# Patient Record
Sex: Male | Born: 1940 | Race: White | Hispanic: No | Marital: Married | Smoking: Never smoker
Health system: Southern US, Community
[De-identification: ages and names within clinical notes are randomized; demographics above are authoritative.]

## PROBLEM LIST (undated history)

## (undated) DIAGNOSIS — I4891 Unspecified atrial fibrillation: Secondary | ICD-10-CM

## (undated) DIAGNOSIS — E785 Hyperlipidemia, unspecified: Secondary | ICD-10-CM

## (undated) DIAGNOSIS — I10 Essential (primary) hypertension: Secondary | ICD-10-CM

## (undated) HISTORY — PX: HERNIA REPAIR: SHX51

## (undated) HISTORY — PX: APPENDECTOMY: SHX54

## (undated) HISTORY — PX: CIRCUMCISION: SUR203

## (undated) HISTORY — PX: REPLACEMENT TOTAL KNEE: SUR1224

## (undated) HISTORY — PX: PROSTATECTOMY: SHX69

## (undated) HISTORY — PX: VASCULAR SURGERY: SHX849

## (undated) HISTORY — PX: SHOULDER ARTHROSCOPY: SHX128

---

## 2014-04-13 ENCOUNTER — Other Ambulatory Visit: Payer: Self-pay | Admitting: Neurosurgery

## 2014-04-13 DIAGNOSIS — M47812 Spondylosis without myelopathy or radiculopathy, cervical region: Secondary | ICD-10-CM

## 2014-04-16 ENCOUNTER — Ambulatory Visit
Admission: RE | Admit: 2014-04-16 | Discharge: 2014-04-16 | Disposition: A | Discharge status: Home / Self Care | Payer: Medicare Other | Source: Ambulatory Visit | Attending: Neurosurgery | Admitting: Neurosurgery

## 2014-04-16 ENCOUNTER — Other Ambulatory Visit: Payer: Self-pay | Admitting: Neurosurgery

## 2014-04-16 DIAGNOSIS — M47812 Spondylosis without myelopathy or radiculopathy, cervical region: Secondary | ICD-10-CM

## 2014-04-16 MED ORDER — IOHEXOL 300 MG/ML  SOLN: 1.0000 mL | Freq: Once | INTRAMUSCULAR | Status: AC | PRN

## 2014-04-16 NOTE — Discharge Instructions (Signed)

## 2021-10-26 ENCOUNTER — Ambulatory Visit: Payer: Medicare Other | Admitting: Family Medicine

## 2021-10-26 ENCOUNTER — Encounter: Payer: Self-pay | Admitting: Family Medicine

## 2021-10-26 ENCOUNTER — Ambulatory Visit: Payer: Self-pay

## 2021-10-26 VITALS — BP 138/56 | Ht 69.0 in | Wt 189.0 lb

## 2021-10-26 DIAGNOSIS — S76819A Strain of other specified muscles, fascia and tendons at thigh level, unspecified thigh, initial encounter: Secondary | ICD-10-CM

## 2021-10-26 DIAGNOSIS — S76012A Strain of muscle, fascia and tendon of left hip, initial encounter: Secondary | ICD-10-CM

## 2021-10-26 DIAGNOSIS — T148XXA Other injury of unspecified body region, initial encounter: Secondary | ICD-10-CM

## 2021-10-26 DIAGNOSIS — S76012D Strain of muscle, fascia and tendon of left hip, subsequent encounter: Secondary | ICD-10-CM | POA: Insufficient documentation

## 2021-10-26 DIAGNOSIS — M79652 Pain in left thigh: Secondary | ICD-10-CM

## 2021-10-26 NOTE — Assessment & Plan Note (Signed)
Acutely occurring.  No injury or inciting event.  Likely stemming from the tear of the gluteus medius proximally.   -Counseled on home exercise therapy and supportive care. -Could consider aspiration.

## 2021-10-26 NOTE — Patient Instructions (Signed)
Nice to meet you Please try compression  Please try try heat  Please continue physical therapy  Please send me a message in MyChart with any questions or updates.  Please see me back in 4-6 weeks or as needed if better.   --Dr. Jordan Likes

## 2021-10-26 NOTE — Assessment & Plan Note (Signed)
Acutely occurring.  Has significant changes at the footprint into the greater trochanter.  No specific injury. -Counseled on home exercise therapy and supportive care. -Could consider physical therapy or injection.

## 2021-10-26 NOTE — Progress Notes (Signed)
  Marcelus Garris - 81 y.o. male MRN [id number]  Date of birth: [birth year]  SUBJECTIVE:  Including CC & ROS.  No chief complaint on file.   Makoy Wyer is a 81 y.o. male that is presenting with acute left thigh pain.  The pain has been ongoing for over a week.  He is having significant bruising around the knee as well as the thigh region.  No injury inciting event.  Was painful enough that he had to walk with a walker at one point.  Has been given some prednisone.   Review of Systems See HPI   HISTORY: Past Medical, Surgical, Social, and Family History Reviewed & Updated per EMR.   Pertinent Historical Findings include:  History reviewed. No pertinent past medical history.  History reviewed. No pertinent surgical history.   PHYSICAL EXAM:  VS: BP (!) 138/56 (BP Location: Left Arm, Patient Position: Sitting)   Ht 5\' 9"  (1.753 m)   Wt 189 lb (85.7 kg)   BMI 27.91 kg/m  Physical Exam Gen: NAD, alert, cooperative with exam, well-appearing MSK:  Neurovascularly intact    Limited ultrasound: Left thigh and knee:  There is a morel lavallee lesion within the left lateral thigh.  This contains a mixed material to represent a bloody matrix. There is hypoechoic change and hyperemia at the insertion of the gluteus medius to indicate partial tear. There is changes within the rectus femoris and vastus intermedius at the mid belly of the muscle to represent a partial strain. Mild effusion within the suprapatellar pouch. Degenerative changes of the medial meniscus and joint space  Summary: Ander Gaster lavallee lesion with partial tear of the gluteus medius and partial tear of the mid belly of the rectus femoris and vastus intermedius  Ultrasound and interpretation by Clearance Coots, MD    ASSESSMENT & PLAN:   Strain of rectus femoris muscle Acutely occurring.  He is having improvement of his symptoms but is unsure of the mechanism of how his injury occurred. -Counseled on home exercise  therapy and supportive care. -Counseled on compression. -Could consider physical therapy.  Tear of left gluteus medius tendon Acutely occurring.  Has significant changes at the footprint into the greater trochanter.  No specific injury. -Counseled on home exercise therapy and supportive care. -Could consider physical therapy or injection.  Sherry Ruffing lesion Acutely occurring.  No injury or inciting event.  Likely stemming from the tear of the gluteus medius proximally.   -Counseled on home exercise therapy and supportive care. -Could consider aspiration.

## 2021-10-26 NOTE — Assessment & Plan Note (Signed)
Acutely occurring.  He is having improvement of his symptoms but is unsure of the mechanism of how his injury occurred. -Counseled on home exercise therapy and supportive care. -Counseled on compression. -Could consider physical therapy.

## 2021-11-10 ENCOUNTER — Ambulatory Visit: Payer: Self-pay

## 2021-11-10 ENCOUNTER — Ambulatory Visit: Payer: Medicare Other | Admitting: Family Medicine

## 2021-11-10 ENCOUNTER — Encounter: Payer: Self-pay | Admitting: Family Medicine

## 2021-11-10 VITALS — BP 118/70 | Ht 69.0 in | Wt 189.0 lb

## 2021-11-10 DIAGNOSIS — T148XXA Other injury of unspecified body region, initial encounter: Secondary | ICD-10-CM | POA: Diagnosis not present

## 2021-11-10 DIAGNOSIS — S76819A Strain of other specified muscles, fascia and tendons at thigh level, unspecified thigh, initial encounter: Secondary | ICD-10-CM | POA: Diagnosis not present

## 2021-11-10 DIAGNOSIS — S76012D Strain of muscle, fascia and tendon of left hip, subsequent encounter: Secondary | ICD-10-CM

## 2021-11-10 NOTE — Assessment & Plan Note (Signed)
Acutely worsening.  Still having pain and weakness with hip flexion.  X-rays performed at the [facility] on 5/30 were only showing degenerative changes within the left hip. -Counseled on home exercise therapy and supportive care. -MRI of the left femur to evaluate for muscle tear and for presurgical planning.

## 2021-11-10 NOTE — Progress Notes (Signed)
  Cameron Webb - 81 y.o. male MRN [id number]  Date of birth: 01/18/1941  SUBJECTIVE:  Including CC & ROS.  No chief complaint on file.   Cameron Webb is a 81 y.o. male that is presenting with worsening of his left leg pain.  Having significant swelling and ecchymosis.    Review of Systems See HPI   HISTORY: Past Medical, Surgical, Social, and Family History Reviewed & Updated per EMR.   Pertinent Historical Findings include:  History reviewed. No pertinent past medical history.  History reviewed. No pertinent surgical history.   PHYSICAL EXAM:  VS: BP 118/70 (BP Location: Left Arm, Patient Position: Sitting)   Ht 5\' 9"  (1.753 m)   Wt 189 lb (85.7 kg)   BMI 27.91 kg/m  Physical Exam Gen: NAD, alert, cooperative with exam, well-appearing MSK:  Left leg: Weakness to gravity with hip abduction. Stiffness along the left lateral and anterior thigh. Significant ecchymosis around the knee and distal thigh. Weakness with resistance with hip flexion  Neurovascularly intact    Limited ultrasound: Left thigh:  A significant hematoma in the left lateral thigh appreciated with a mixed internal architecture that measures 16 cm.  Summary: lesion  Ultrasound and interpretation by Norma Fredrickson, MD   Aspiration/Injection Procedure Note Cameron Webb [birth year]  Procedure: Aspiration and Injection Indications: Left thigh pain  Procedure Details Consent: Risks of procedure as well as the alternatives and risks of each were explained to the (patient/caregiver).  Consent for procedure obtained. Time Out: Verified patient identification, verified procedure, site/side was marked, verified correct patient position, special equipment/implants available, medications/allergies/relevent history reviewed, required imaging and test results available.  Performed.  The area was cleaned with iodine and alcohol swabs.    The left lateral thigh was injected using 3 cc of  1% lidocaine on the distal end of the hematoma as well as the mid belly of the hematoma using a 25-gauge 1-1/2 inch needle.  An 18-gauge 1-1/2 inch needle was used for aspiration.  An 18-gauge 1-1/2 needle with 400 cc of normal saline for irrigation was used.  Ultrasound was used. Images were obtained in long views showing the injection.    Amount of Fluid Aspirated:  90mL Character of Fluid: bloody Fluid was sent for: n/a  A sterile dressing was applied.  Patient did tolerate procedure well.    ASSESSMENT & PLAN:   72m lesion Acutely worsening.  The hematoma is extensive. -Counseled on home exercise therapy and supportive care. -Aspiration today. -MRI of the left femur to evaluate for size of the hematoma and for presurgical planning.  Strain of rectus femoris muscle Acutely worsening.  Still having pain and weakness with hip flexion.  X-[facility] performed at [facility] were only showing degenerative changes within the left hip. -Counseled on home exercise therapy and supportive care. -MRI of the left femur to evaluate for muscle tear and for presurgical planning.  Tear of left gluteus medius tendon Acutely worsening.  Having weakness with hip abduction.  X-rays performed at the [facility]30 on 5/30 have been under resulting.  Continues to have significant swelling and ecchymosis on exam. -Counseled on home exercise therapy and supportive care. -MRI of the pelvis to evaluate for tendon rupture and for presurgical planning.

## 2021-11-10 NOTE — Patient Instructions (Signed)
Good to see you Please continue heat and compression  We'll check on the MRi   Please send me a message in MyChart with any questions or updates.  We'll setup a virtual visit once the MRi is resulted.   --Dr. Jordan Likes

## 2021-11-10 NOTE — Assessment & Plan Note (Signed)
Acutely worsening.  Having weakness with hip abduction.  X-rays performed at the [place] on 5/30 have been under resulting.  Continues to have significant swelling and ecchymosis on exam. -Counseled on home exercise therapy and supportive care. -MRI of the pelvis to evaluate for tendon rupture and for presurgical planning.

## 2021-11-10 NOTE — Assessment & Plan Note (Signed)
Acutely worsening.  The hematoma is extensive. -Counseled on home exercise therapy and supportive care. -Aspiration today. -MRI of the left femur to evaluate for size of the hematoma and for presurgical planning.

## 2021-11-14 ENCOUNTER — Telehealth (HOSPITAL_BASED_OUTPATIENT_CLINIC_OR_DEPARTMENT_OTHER): Payer: Self-pay

## 2021-11-16 ENCOUNTER — Encounter: Payer: Self-pay | Admitting: Family Medicine

## 2021-11-16 ENCOUNTER — Ambulatory Visit: Payer: Medicare Other | Admitting: Family Medicine

## 2021-11-16 DIAGNOSIS — S76819A Strain of other specified muscles, fascia and tendons at thigh level, unspecified thigh, initial encounter: Secondary | ICD-10-CM | POA: Diagnosis not present

## 2021-11-16 DIAGNOSIS — T148XXA Other injury of unspecified body region, initial encounter: Secondary | ICD-10-CM

## 2021-11-16 DIAGNOSIS — S76012D Strain of muscle, fascia and tendon of left hip, subsequent encounter: Secondary | ICD-10-CM | POA: Diagnosis not present

## 2021-11-16 NOTE — Assessment & Plan Note (Signed)
Acutely occurring.  Still having pain in the anterior aspect of the leg.  Having weakness on exam. -Counseled on home exercise therapy and supportive care. -Continue MRI of the left femur to evaluate for rupture and for presurgical planning.

## 2021-11-16 NOTE — Assessment & Plan Note (Signed)
Acutely occurring.  Sizable difference from last week.  Has less fullness and less ecchymosis.  Has better range of motion and less pain. -Counseled on home exercise therapy and supportive care. -We will further evaluate with the MRI of the left femur for consideration of presurgical planning.

## 2021-11-16 NOTE — Progress Notes (Signed)
  [name] - 81 y.o. male MRN [id number]  Date of birth: 12-16-1940  SUBJECTIVE:  Including CC & ROS.  No chief complaint on file.   Cameron Webb is a 81 y.o. male that is following up for his left hip pain and left thigh pain.  He has gotten improvement with the previous aspiration.  The ecchymosis is continuing to improve in the lower aspect of his leg as well.   Review of Systems See HPI   HISTORY: Past Medical, Surgical, Social, and Family History Reviewed & Updated per EMR.   Pertinent Historical Findings include:  History reviewed. No pertinent past medical history.  History reviewed. No pertinent surgical history.   PHYSICAL EXAM:  VS: BP 126/62 (BP Location: Left Arm, Patient Position: Sitting)   Ht 5\' 9"  (1.753 m)   Wt 189 lb (85.7 kg)   BMI 27.91 kg/m  Physical Exam Gen: NAD, alert, cooperative with exam, well-appearing MSK:  Neurovascularly intact       ASSESSMENT & PLAN:   Tear of left gluteus medius tendon Still acutely occurring.  It appears that the bleeding has stopped.  His bruising has been improving.  Still having weakness on exam. -Counseled on home exercise therapy and supportive care. -Continue with the MRI of the pelvis to evaluate for rupture  Strain of rectus femoris muscle Acutely occurring.  Still having pain in the anterior aspect of the leg.  Having weakness on exam. -Counseled on home exercise therapy and supportive care. -Continue MRI of the left femur to evaluate for rupture and for presurgical planning.  lesion Acutely occurring.  Sizable difference from last week.  Has less fullness and less ecchymosis.  Has better range of motion and less pain. -Counseled on home exercise therapy and supportive care. -We will further evaluate with the MRI of the left femur for consideration of presurgical planning.

## 2021-11-16 NOTE — Assessment & Plan Note (Signed)
Still acutely occurring.  It appears that the bleeding has stopped.  His bruising has been improving.  Still having weakness on exam. -Counseled on home exercise therapy and supportive care. -Continue with the MRI of the pelvis to evaluate for rupture

## 2021-11-18 ENCOUNTER — Ambulatory Visit (HOSPITAL_BASED_OUTPATIENT_CLINIC_OR_DEPARTMENT_OTHER)
Admission: RE | Admit: 2021-11-18 | Discharge: 2021-11-18 | Disposition: A | Discharge status: Home / Self Care | Payer: Medicare Other | Source: Ambulatory Visit | Attending: Family Medicine | Admitting: Family Medicine

## 2021-11-18 DIAGNOSIS — S76012D Strain of muscle, fascia and tendon of left hip, subsequent encounter: Secondary | ICD-10-CM

## 2021-11-18 DIAGNOSIS — S76819A Strain of other specified muscles, fascia and tendons at thigh level, unspecified thigh, initial encounter: Secondary | ICD-10-CM | POA: Insufficient documentation

## 2021-11-18 DIAGNOSIS — T148XXA Other injury of unspecified body region, initial encounter: Secondary | ICD-10-CM | POA: Diagnosis present

## 2021-11-18 IMAGING — MR MR PELVIS W/O CM
4 of 5 series · 16 of 48 positions shown · non-contrast
Comparison: None Available.

CLINICAL DATA: Soft tissue mass, pelvis, US/xray nondiagnostic;
Soft tissue mass, thigh, US/xray nondiagnostic

EXAM:
MRI PELVIS WITHOUT CONTRAST
TECHNIQUE: Multiplanar multisequence MR imaging of the pelvis was performed. No
intravenous contrast was administered.

[Series 2: T1 · axial · 4.0mm · 0.49mm/px · z∈[-38,+77]mm · 7 of 28 slices shown (1 of 2)]
[im 1/28]
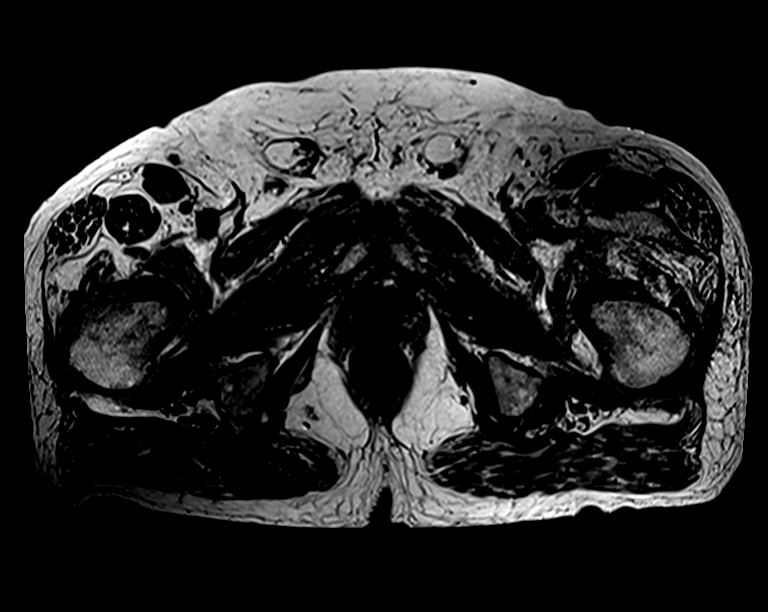
[im 4/28]
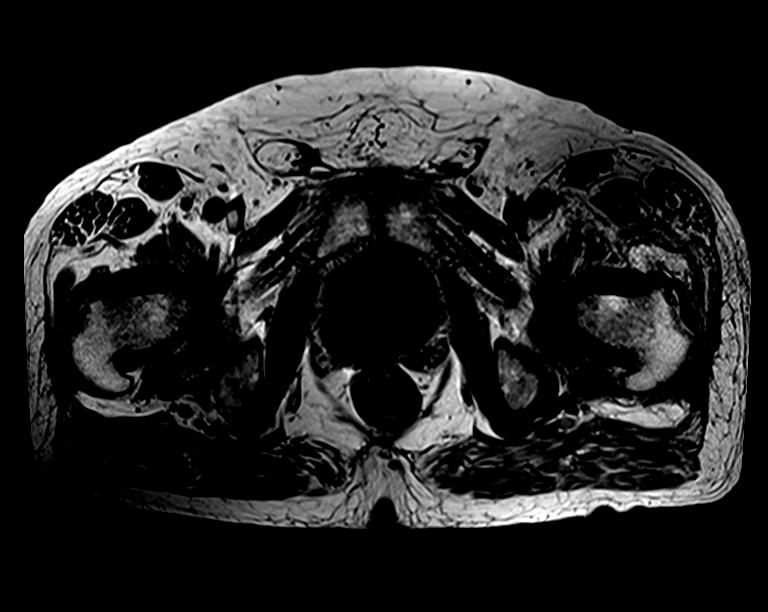
[im 7/28]
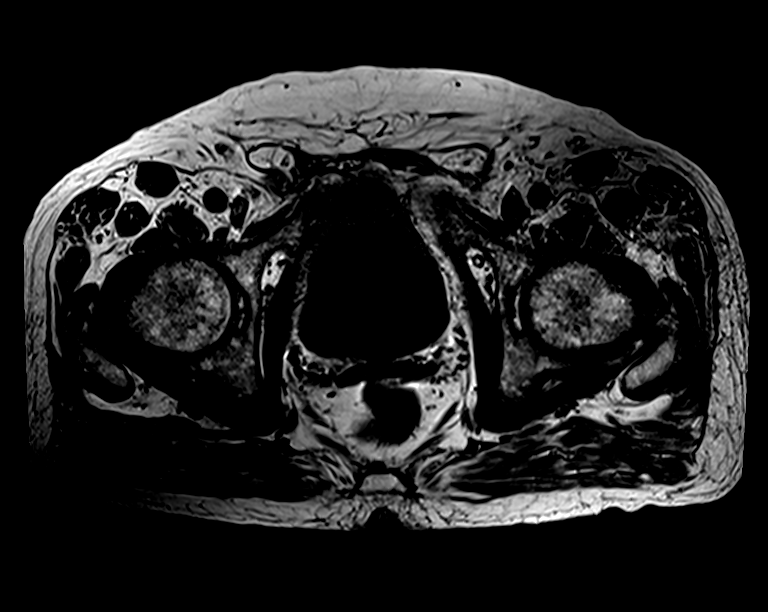
[im 11/28]
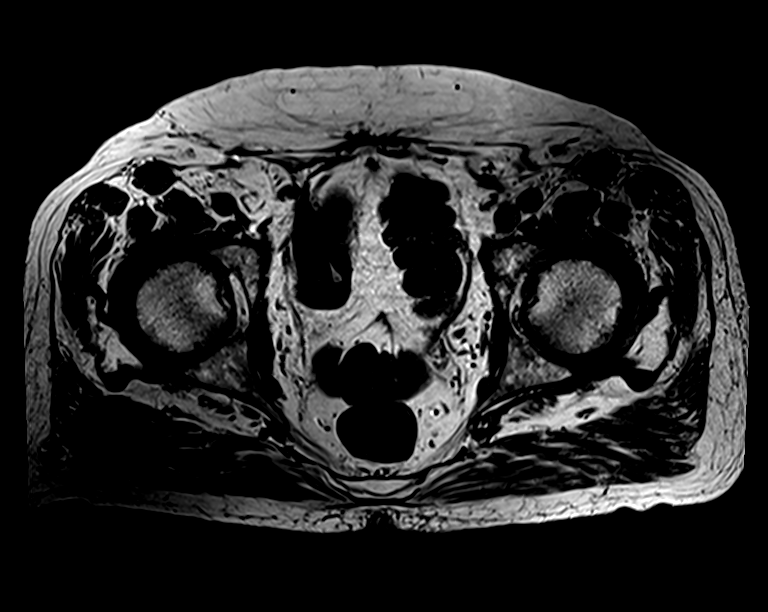
[im 14/28]
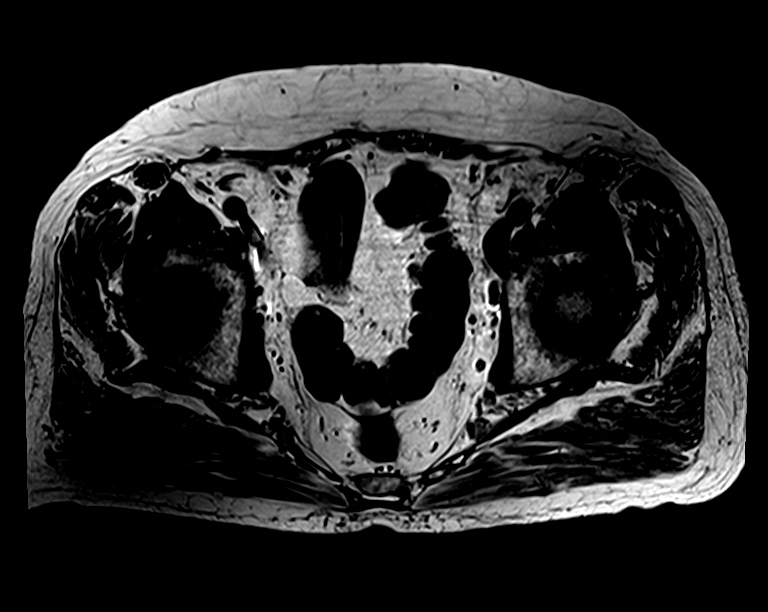
[im 17/28]
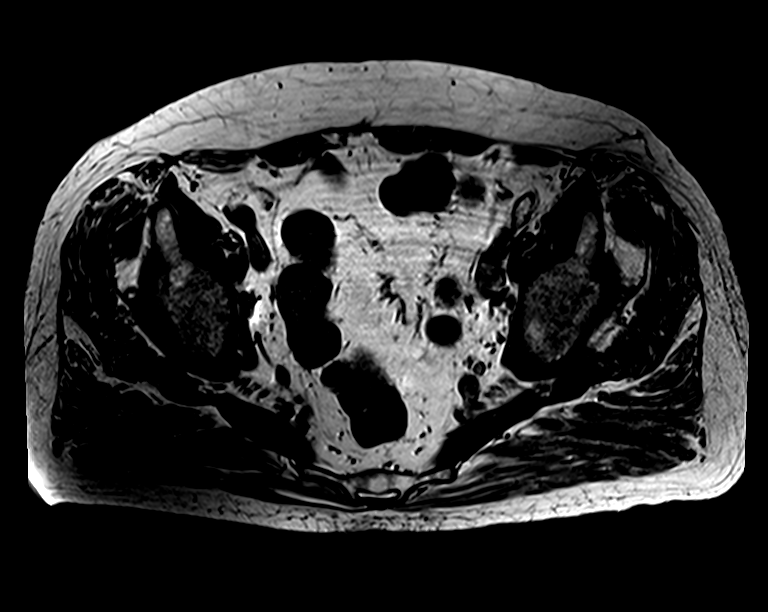
[im 24/28]
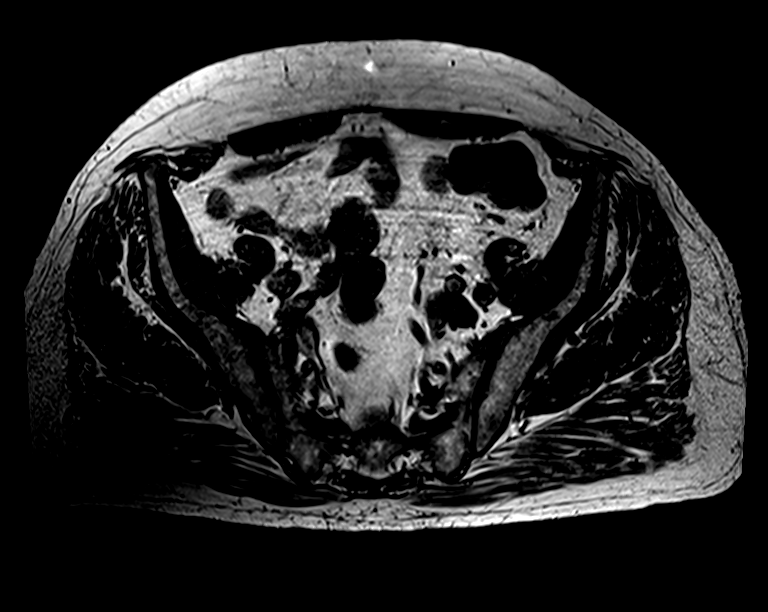

[Series 3: T2 fat-sat · axial · 4.0mm · 1.19mm/px · z∈[-20,+75]mm · 3 of 27 slices shown]
[im 4/27]
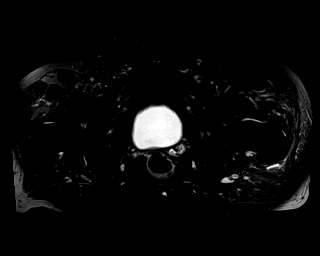
[im 15/27]
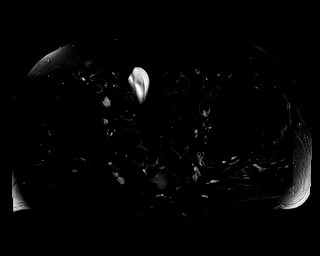
[im 23/27]
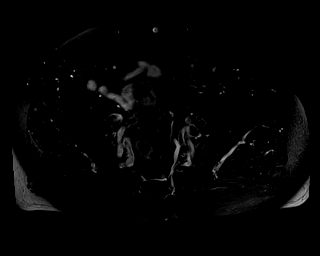

[Series 4: T1 · coronal · 4.0mm · 0.78mm/px · 3 of 34 slices shown (2 of 2)]
[im 4/34]
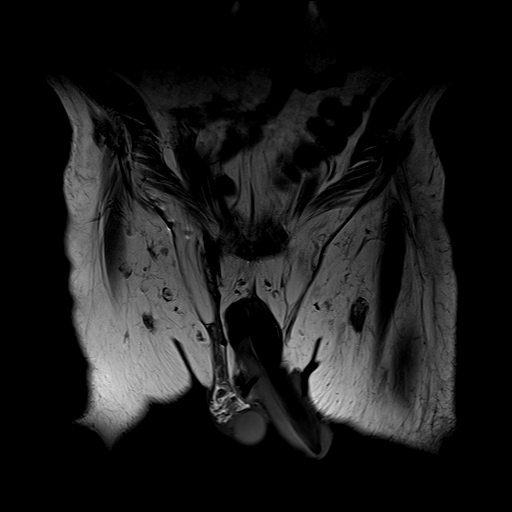
[im 17/34]
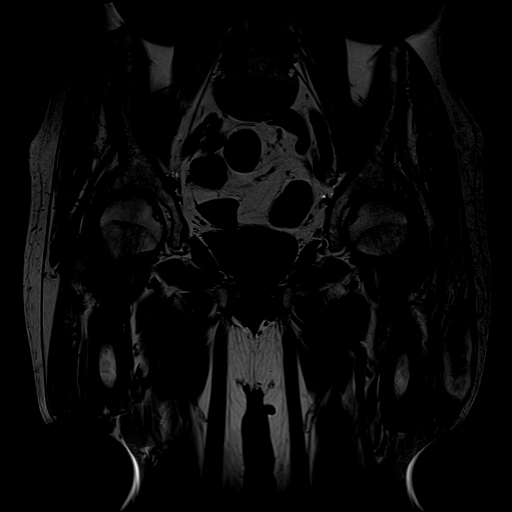
[im 30/34]
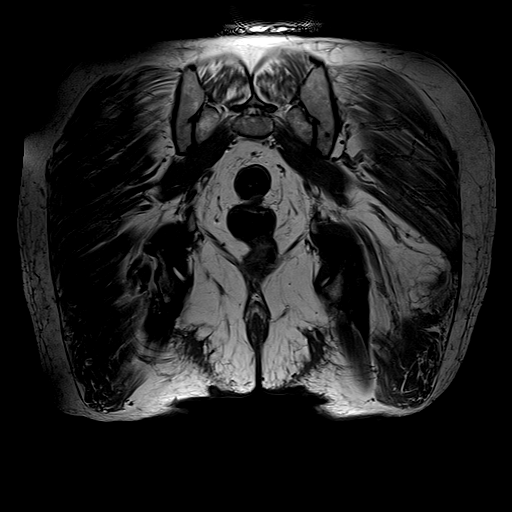

[Series 5: STIR · coronal · 4.0mm · 1.04mm/px · 3 of 34 slices shown]
[im 4/34]
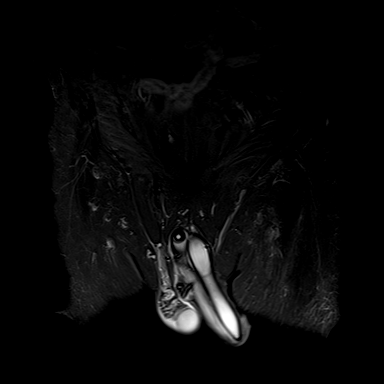
[im 17/34]
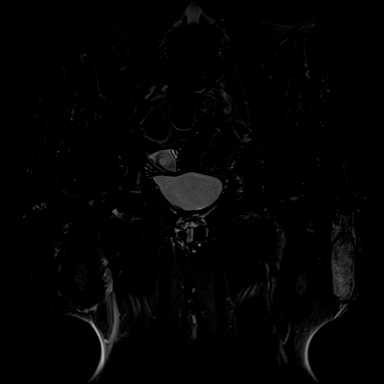
[im 30/34]
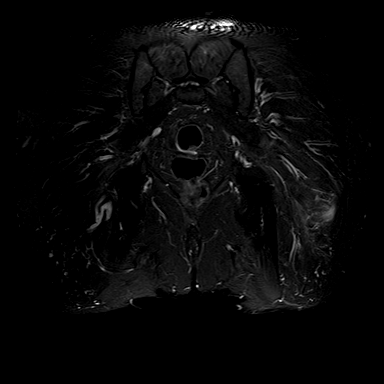

[16 of 48 positions shown; findings below may reference images not displayed]

FINDINGS: MR PELVIS:

Urinary Tract:  No abnormality visualized.

Bowel:  Unremarkable visualized pelvic bowel loops.

Vascular/Lymphatic: No pathologically enlarged lymph nodes. No
significant vascular abnormality seen.

Reproductive: There is a penile implant reservoir for in the pelvis.

Other: There are small fat containing inguinal hernias, left greater
than right.

Musculoskeletal: There is mild to moderate osteoarthritis of the
hips bilaterally. No evidence of acute fracture. No focal bone
lesion. There are mild degenerative changes of the sacroiliac joints
and of the pubic symphysis. Degenerative disc disease in the lower
lumbar spine.

There is low-grade insertional tearing of the left gluteus minimus
tendon at the greater trochanter. The left gluteus medius tendon
appears intact. There is mild peritrochanteric edema and
trochanteric bursal distension. Mild tendinosis of the right gluteal
tendons. Mild stenosis of the left proximal hamstrings.

MR FEMUR:

Bones/Joint/Cartilage

There is no significant marrow signal alteration. The cortex is
intact. There is a suspected nondisplaced horizontal tear involving
the medial meniscal body-horn-body junction. There is tear involving
the inner zone of the lateral meniscal body as seen on prior MRI.

Ligaments

Sprain and partial tearing of the left knee PCL as seen on prior
MRI.

Muscles and Tendons
There is mild tendinosis of the left proximal hamstrings. There is a
large heterogeneous collection along the anterolateral thigh
measuring 7.9 x 4.0 in the axial dimension and 18.3 cm in
craniocaudal extent (series 17, image 25, series 13 image 27). This
is along the surface of the vastus lateralis, with continuation
proximally deep to the proximal rectus femoris. There is inter
fascial edema along the rectus femoris with focal defect along the
lateral muscle belly likely reflecting a myofascial tear (series 17,
images 31-33). There is mild intramuscular edema signal within the
vastus intermedius proximally and of the vastus lateralis distally
likely low-grade myotendinous strain.

Soft tissue
There is superficial soft tissue swelling.
IMPRESSION: Large heterogeneous collection along the anterolateral left thigh
measuring 7.9 x 4.0 cm in the axial dimension and 8.3 cm in
craniocaudal extent. This is most consistent with a large
hematoma/LEZAMA lesion. Recommend clinical follow-up.

Interfascial edema with associated probable small myofascial tear
involving the lateral aspect of the proximal rectus femoris.
Low-grade strain of the vastus intermedius proximally and of the
vastus lateralis distally.

Low-grade insertional tearing of the distal gluteus minimus tendon
at the greater trochanter. Intact gluteus medius tendon. Adjacent
peritrochanteric edema and mild bursal distension.

No acute osseous abnormality. Mild-to-moderate osteoarthritis of the
hips.

## 2021-11-18 IMAGING — MR MR FEMUR*L* W/O CM
10 series · 40 of 40 positions shown · non-contrast
Comparison: None Available.

CLINICAL DATA: Soft tissue mass, pelvis, US/xray nondiagnostic;
Soft tissue mass, thigh, US/xray nondiagnostic

EXAM:
MRI PELVIS WITHOUT CONTRAST
TECHNIQUE: Multiplanar multisequence MR imaging of the pelvis was performed. No
intravenous contrast was administered.

[Series 11: T1 · coronal · 5.0mm · 1.56mm/px · 4 of 42 slices shown (1 of 4)]
[im 1/42]
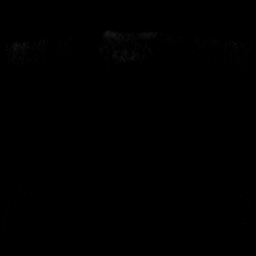
[im 14/42]
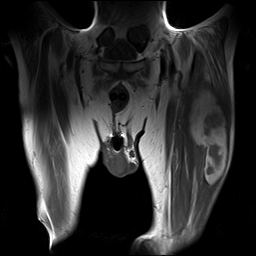
[im 28/42]
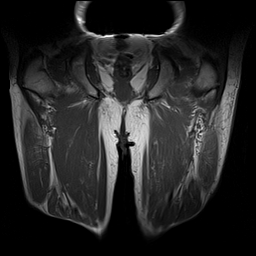
[im 42/42]
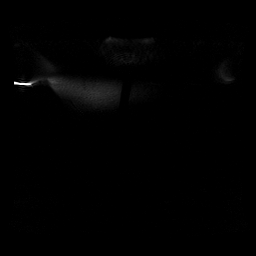

[Series 12: T1 · coronal · 5.0mm · 1.56mm/px · 4 of 36 slices shown (2 of 4)]
[im 1/36]
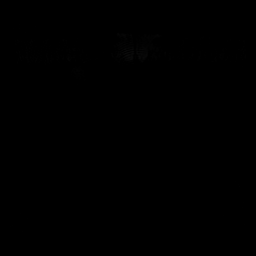
[im 12/36]
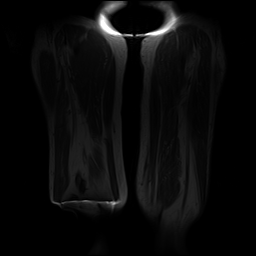
[im 24/36]
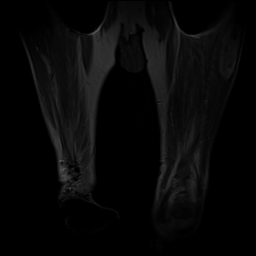
[im 36/36]
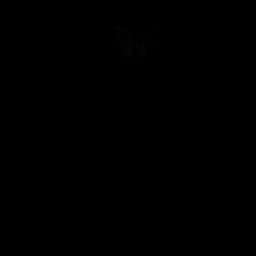

[Series 13: STIR · coronal · 5.0mm · 1.56mm/px · 4 of 42 slices shown (1 of 4)]
[im 1/42]
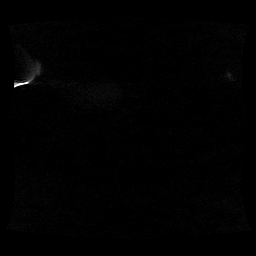
[im 14/42]
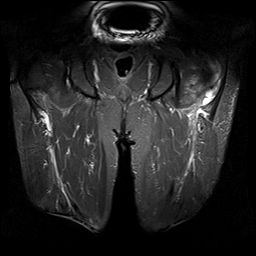
[im 28/42]
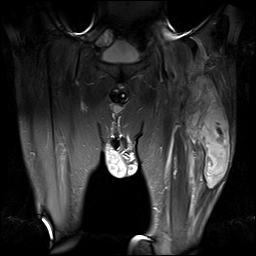
[im 42/42]
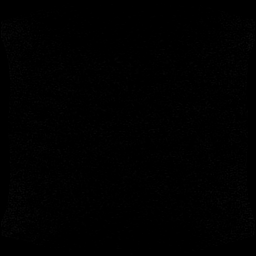

[Series 14: STIR · coronal · 5.0mm · 1.56mm/px · 4 of 35 slices shown (2 of 4)]
[im 1/35]
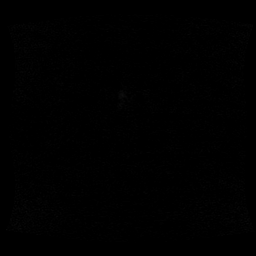
[im 12/35]
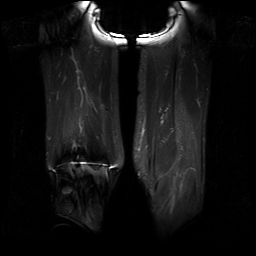
[im 23/35]
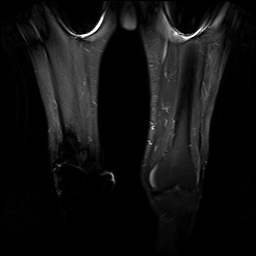
[im 35/35]
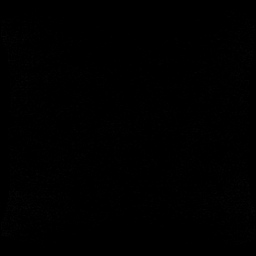

[Series 15: STIR · sagittal · 4.0mm · 1.17mm/px · 4 of 38 slices shown (3 of 4)]
[im 1/38]
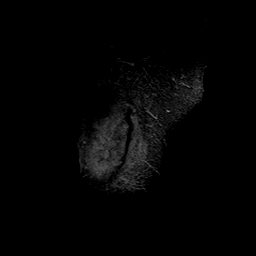
[im 13/38]
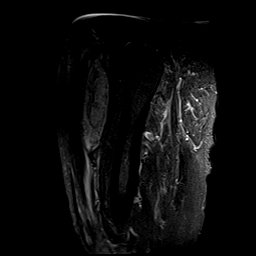
[im 25/38]
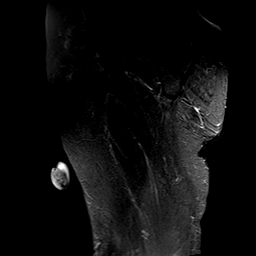
[im 38/38]
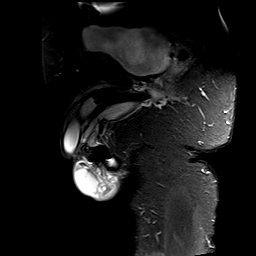

[Series 16: STIR · sagittal · 4.0mm · 1.17mm/px · 4 of 38 slices shown (4 of 4)]
[im 1/38]
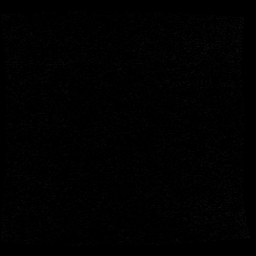
[im 13/38]
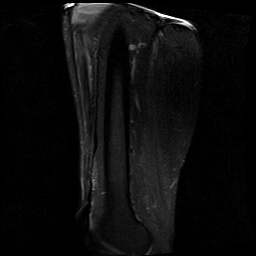
[im 25/38]
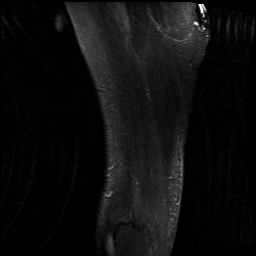
[im 38/38]
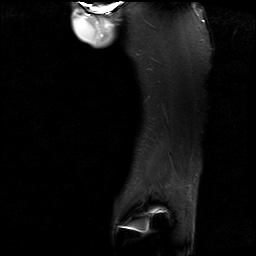

[Series 17: T2 fat-sat · axial · 5.0mm · 1.02mm/px · z∈[-217,+36]mm · 4 of 40 slices shown (1 of 2)]
[im 1/40]
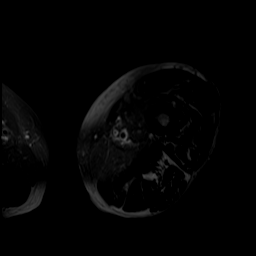
[im 14/40]
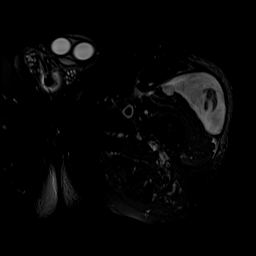
[im 27/40]
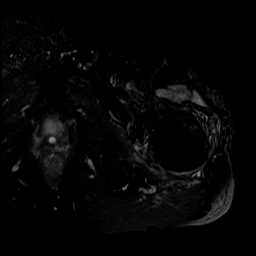
[im 40/40]
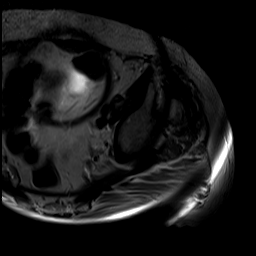

[Series 18: T2 fat-sat · axial · 5.0mm · 1.02mm/px · z∈[-456,-202]mm · 4 of 40 slices shown (2 of 2)]
[im 1/40]
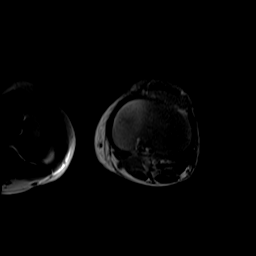
[im 14/40]
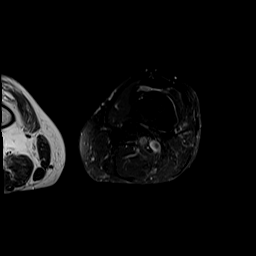
[im 27/40]
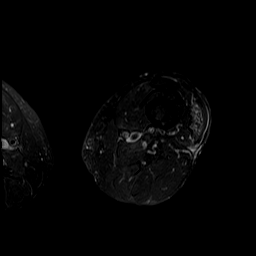
[im 40/40]
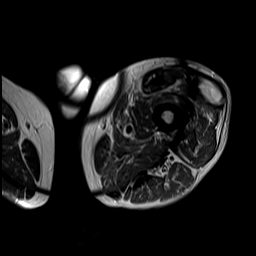

[Series 19: T1 · axial · 5.0mm · 1.02mm/px · z∈[-217,+36]mm · 4 of 40 slices shown (3 of 4)]
[im 1/40]
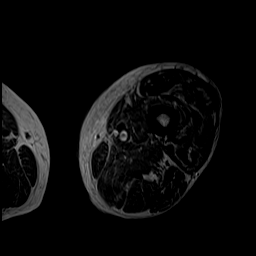
[im 14/40]
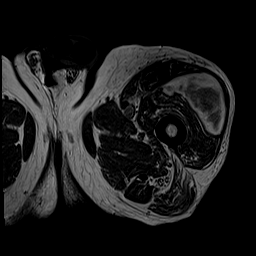
[im 27/40]
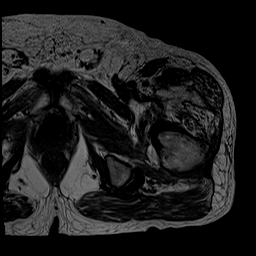
[im 40/40]
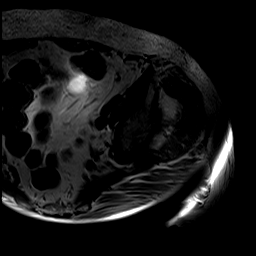

[Series 20: T1 · axial · 5.0mm · 1.02mm/px · z∈[-456,-202]mm · 4 of 40 slices shown (4 of 4)]
[im 1/40]
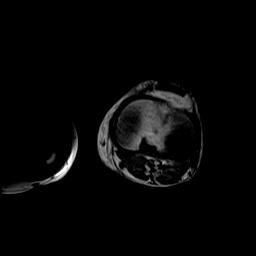
[im 14/40]
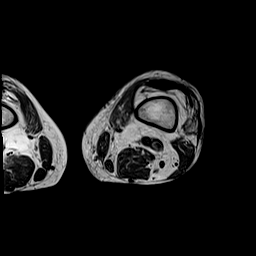
[im 27/40]
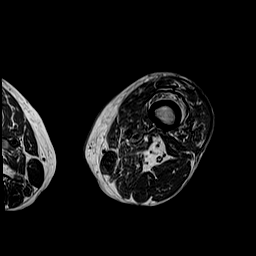
[im 40/40]
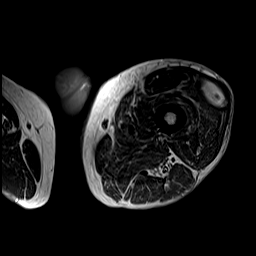

[40 of 40 positions shown; findings below may reference images not displayed]

FINDINGS: MR PELVIS:

Urinary Tract:  No abnormality visualized.

Bowel:  Unremarkable visualized pelvic bowel loops.

Vascular/Lymphatic: No pathologically enlarged lymph nodes. No
significant vascular abnormality seen.

Reproductive: There is a penile implant reservoir for in the pelvis.

Other: There are small fat containing inguinal hernias, left greater
than right.

Musculoskeletal: There is mild to moderate osteoarthritis of the
hips bilaterally. No evidence of acute fracture. No focal bone
lesion. There are mild degenerative changes of the sacroiliac joints
and of the pubic symphysis. Degenerative disc disease in the lower
lumbar spine.

There is low-grade insertional tearing of the left gluteus minimus
tendon at the greater trochanter. The left gluteus medius tendon
appears intact. There is mild peritrochanteric edema and
trochanteric bursal distension. Mild tendinosis of the right gluteal
tendons. Mild stenosis of the left proximal hamstrings.

MR FEMUR:

Bones/Joint/Cartilage

There is no significant marrow signal alteration. The cortex is
intact. There is a suspected nondisplaced horizontal tear involving
the medial meniscal body-horn-body junction. There is tear involving
the inner zone of the lateral meniscal body as seen on prior MRI.

Ligaments

Sprain and partial tearing of the left knee PCL as seen on prior
MRI.

Muscles and Tendons
There is mild tendinosis of the left proximal hamstrings. There is a
large heterogeneous collection along the anterolateral thigh
measuring 7.9 x 4.0 in the axial dimension and 18.3 cm in
craniocaudal extent (series 17, image 25, series 13 image 27). This
is along the surface of the vastus lateralis, with continuation
proximally deep to the proximal rectus femoris. There is inter
fascial edema along the rectus femoris with focal defect along the
lateral muscle belly likely reflecting a myofascial tear (series 17,
images 31-33). There is mild intramuscular edema signal within the
vastus intermedius proximally and of the vastus lateralis distally
likely low-grade myotendinous strain.

Soft tissue
There is superficial soft tissue swelling.
IMPRESSION: Large heterogeneous collection along the anterolateral left thigh
measuring 7.9 x 4.0 cm in the axial dimension and 8.3 cm in
craniocaudal extent. This is most consistent with a large
hematoma/LEZAMA lesion. Recommend clinical follow-up.

Interfascial edema with associated probable small myofascial tear
involving the lateral aspect of the proximal rectus femoris.
Low-grade strain of the vastus intermedius proximally and of the
vastus lateralis distally.

Low-grade insertional tearing of the distal gluteus minimus tendon
at the greater trochanter. Intact gluteus medius tendon. Adjacent
peritrochanteric edema and mild bursal distension.

No acute osseous abnormality. Mild-to-moderate osteoarthritis of the
hips.

## 2021-11-23 ENCOUNTER — Encounter: Payer: Self-pay | Admitting: Family Medicine

## 2021-11-23 ENCOUNTER — Ambulatory Visit: Payer: Medicare Other | Admitting: Family Medicine

## 2021-11-23 DIAGNOSIS — S76819A Strain of other specified muscles, fascia and tendons at thigh level, unspecified thigh, initial encounter: Secondary | ICD-10-CM | POA: Diagnosis not present

## 2021-11-23 DIAGNOSIS — T148XXA Other injury of unspecified body region, initial encounter: Secondary | ICD-10-CM

## 2021-11-23 DIAGNOSIS — S76012D Strain of muscle, fascia and tendon of left hip, subsequent encounter: Secondary | ICD-10-CM

## 2021-11-23 NOTE — Assessment & Plan Note (Signed)
MRI was showing a partial tear of the proximal portion as well as the vastus intermedius. -Counseled on home exercise therapy and supportive care. -Continue physical therapy. -Could consider shockwave therapy.

## 2021-11-23 NOTE — Assessment & Plan Note (Signed)
This was confirmed on MRI.  Has shown improvement since initial aspiration and ultrasound. -Counseled on home exercise therapy and supportive care. -Continue physical therapy. -Could consider referral to surgery for evacuation.

## 2021-11-23 NOTE — Progress Notes (Signed)
  Cameron Webb - 81 y.o. male MRN [id number]  Date of birth: 1940/08/04  SUBJECTIVE:  Including CC & ROS.  No chief complaint on file.   Cameron Webb is a 81 y.o. male that is following up on the MRI of his pelvis and left femur.  These were demonstrating a lesion on the left lateral thigh that seems most consistent with [facility] lesion.  This was also demonstrating interstitial edema with a tear in the proximal rectus femoris and a strain of the vastus intermedius.  His gluteus minimus tendon also demonstrated tearing at the distal insertion.   Review of Systems See HPI   HISTORY: Past Medical, Surgical, Social, and Family History Reviewed & Updated per EMR.   Pertinent Historical Findings include:  History reviewed. No pertinent past medical history.  History reviewed. No pertinent surgical history.   PHYSICAL EXAM:  VS: BP 128/69 (BP Location: Left Arm, Patient Position: Sitting)   Ht 5\' 9"  (1.753 m)   Wt 189 lb (85.7 kg)   BMI 27.91 kg/m  Physical Exam Gen: NAD, alert, cooperative with exam, well-appearing MSK:  Neurovascularly intact       ASSESSMENT & PLAN:   lesion This was confirmed on MRI.  Has shown improvement since initial aspiration and ultrasound. -Counseled on home exercise therapy and supportive care. -Continue physical therapy. -Could consider referral to surgery for evacuation.  Strain of rectus femoris muscle MRI was showing a partial tear of the proximal portion as well as the vastus intermedius. -Counseled on home exercise therapy and supportive care. -Continue physical therapy. -Could consider shockwave therapy.  Rupture of gluteus minimus tendon, left, subsequent encounter MRI was demonstrating gluteus minimus is the issue. -Counseled on home exercise therapy and supportive care. -Continue physical therapy. -Could consider shockwave therapy or injection.

## 2021-11-23 NOTE — Assessment & Plan Note (Signed)
MRI was demonstrating gluteus minimus is the issue. -Counseled on home exercise therapy and supportive care. -Continue physical therapy. -Could consider shockwave therapy or injection.

## 2022-01-04 ENCOUNTER — Encounter: Payer: Self-pay | Admitting: Family Medicine

## 2022-01-04 ENCOUNTER — Ambulatory Visit: Payer: Medicare Other | Admitting: Family Medicine

## 2022-01-04 DIAGNOSIS — T148XXA Other injury of unspecified body region, initial encounter: Secondary | ICD-10-CM

## 2022-01-04 NOTE — Patient Instructions (Signed)
Good to see you Please use heat as needed  Please continue the exercises   Please send me a message in MyChart with any questions or updates.  Please see me back as needed.   --Dr. Ernest Pine DO [facility] [address] [facility]  Velna Hatchet MD [facility] [address]  Verdie Mosher MD [facility] [address]

## 2022-01-04 NOTE — Assessment & Plan Note (Signed)
Doing well with near complete resolution of his symptoms with therapy. -Counseled on home exercise therapy and supportive care. -Could consider shockwave therapy.

## 2022-01-04 NOTE — Progress Notes (Signed)
  Cameron Webb - 81 y.o. male MRN [id number]  Date of birth: 06-30-1940  SUBJECTIVE:  Including CC & ROS.  No chief complaint on file.   Cameron Webb is a 81 y.o. male that is following up for his left leg pain.  He has significant improvement with physical therapy and no longer has swelling or ecchymosis.   Review of Systems See HPI   HISTORY: Past Medical, Surgical, Social, and Family History Reviewed & Updated per EMR.   Pertinent Historical Findings include:  History reviewed. No pertinent past medical history.  History reviewed. No pertinent surgical history.   PHYSICAL EXAM:  VS: BP 108/60 (BP Location: Left Arm, Patient Position: Sitting)   Ht 5\' 9"  (1.753 m)   Wt 189 lb (85.7 kg)   BMI 27.91 kg/m  Physical Exam Gen: NAD, alert, cooperative with exam, well-appearing MSK:  Neurovascularly intact       ASSESSMENT & PLAN:   lesion Doing well with near complete resolution of his symptoms with therapy. -Counseled on home exercise therapy and supportive care. -Could consider shockwave therapy.

## 2022-01-27 ENCOUNTER — Encounter (HOSPITAL_BASED_OUTPATIENT_CLINIC_OR_DEPARTMENT_OTHER): Payer: Self-pay | Admitting: Emergency Medicine

## 2022-01-27 ENCOUNTER — Emergency Department (HOSPITAL_BASED_OUTPATIENT_CLINIC_OR_DEPARTMENT_OTHER)
Admission: EM | Admit: 2022-01-27 | Discharge: 2022-01-27 | Disposition: A | Discharge status: Home / Self Care | Payer: Medicare Other | Attending: Emergency Medicine | Admitting: Emergency Medicine

## 2022-01-27 DIAGNOSIS — B029 Zoster without complications: Secondary | ICD-10-CM | POA: Diagnosis not present

## 2022-01-27 DIAGNOSIS — R21 Rash and other nonspecific skin eruption: Secondary | ICD-10-CM | POA: Diagnosis present

## 2022-01-27 MED ORDER — VALACYCLOVIR HCL 1 G PO TABS: 1000.0000 mg | ORAL_TABLET | Freq: Three times a day (TID) | ORAL | 0 refills | Status: DC

## 2022-01-27 NOTE — Discharge Instructions (Addendum)
Take the Valtrex antiviral medicine for the shingles.  Take it as directed for 7 days.  Avoid contact with people that have not had the chickenpox or young children or pregnant women.  Keep the area covered.  Follow-up with your doctor as needed.

## 2022-01-27 NOTE — ED Triage Notes (Signed)
Pt with small rash to LT side; wants to make sure it's not shingles

## 2022-01-27 NOTE — ED Provider Notes (Signed)
[facility] EMERGENCY DEPARTMENT Provider Note   CSN: [id number] Arrival date & time: 01/27/22  1850     History  Chief Complaint  Patient presents with   Rash    Cameron Webb is a 81 y.o. male.  Patient concerned about the shingles noticed a rash development on his left lateral chest wall picking up this morning.  Not itching in nature.  Has blisters no pain associated with it before hand.  Patient has had the shingles vaccine.  Past medical history noncontributory otherwise.       Home Medications Prior to Admission medications   Medication Sig Start Date End Date Taking? Authorizing Provider  valACYclovir (VALTREX) 1000 MG tablet Take 1 tablet (1,000 mg total) by mouth 3 (three) times daily. 01/27/22  Yes Vanetta Mulders, MD      Allergies    Patient has no known allergies.    Review of Systems   Review of Systems  Constitutional:  Negative for chills and fever.  HENT:  Negative for ear pain and sore throat.   Eyes:  Negative for pain and visual disturbance.  Respiratory:  Negative for cough and shortness of breath.   Cardiovascular:  Negative for chest pain and palpitations.  Gastrointestinal:  Negative for abdominal pain and vomiting.  Genitourinary:  Negative for dysuria and hematuria.  Musculoskeletal:  Negative for arthralgias and back pain.  Skin:  Positive for rash. Negative for color change.  Neurological:  Negative for seizures and syncope.  All other systems reviewed and are negative.   Physical Exam Updated Vital Signs BP (!) 155/71   Pulse 66   Temp 98.7 F (37.1 C) (Oral)   Resp 18   Ht 1.753 m (5\' 9" )   Wt 89.8 kg   SpO2 98%   BMI 29.24 kg/m  Physical Exam Vitals and nursing note reviewed.  Constitutional:      General: He is not in acute distress.    Appearance: Normal appearance. He is well-developed. He is not ill-appearing.  HENT:     Head: Normocephalic and atraumatic.  Eyes:     Conjunctiva/sclera: Conjunctivae  normal.  Cardiovascular:     Rate and Rhythm: Normal rate and regular rhythm.     Heart sounds: No murmur heard. Pulmonary:     Effort: Pulmonary effort is normal. No respiratory distress.     Breath sounds: Normal breath sounds.  Abdominal:     Palpations: Abdomen is soft.     Tenderness: There is no abdominal tenderness.  Musculoskeletal:        General: No swelling.     Cervical back: Neck supple.  Skin:    General: Skin is warm and dry.     Capillary Refill: Capillary refill takes less than 2 seconds.     Findings: Erythema and rash present.     Comments: Cluster of vesicular type lesions left lateral chest area.  Somewhat circumferential not linear.  Also there is 1 lesion more anteriorly from that.  Posterior part of the back without evidence of any rash.  Neurological:     General: No focal deficit present.     Mental Status: He is alert and oriented to person, place, and time.  Psychiatric:        Mood and Affect: Mood normal.     ED Results / Procedures / Treatments   Labs (all labs ordered are listed, but only abnormal results are displayed) Labs Reviewed - No data to display  EKG None  Radiology  No results found.  Procedures Procedures    Medications Ordered in ED Medications - No data to display  ED Course/ Medical Decision Making/ A&P                           Medical Decision Making Risk Prescription drug management.   Visually suggestive of shingles.  But no pain associated with it.  Also no itching.  So I doubt the contact dermatitis.  It is a vesicular rash lesion does seem to follow a little bit of a dermatome.  We will start him when on Valtrex.  And follow-up with his primary care doctor. Final Clinical Impression(s) / ED Diagnoses Final diagnoses:  Herpes zoster without complication    Rx / DC Orders ED Discharge Orders          Ordered    valACYclovir (VALTREX) 1000 MG tablet  3 times daily        01/27/22 1928               Vanetta Mulders, MD 01/27/22 [phone]

## 2022-05-26 ENCOUNTER — Other Ambulatory Visit: Payer: Self-pay

## 2022-05-26 ENCOUNTER — Encounter (HOSPITAL_BASED_OUTPATIENT_CLINIC_OR_DEPARTMENT_OTHER): Payer: Self-pay | Admitting: Emergency Medicine

## 2022-05-26 ENCOUNTER — Emergency Department (HOSPITAL_BASED_OUTPATIENT_CLINIC_OR_DEPARTMENT_OTHER)
Admission: EM | Admit: 2022-05-26 | Discharge: 2022-05-26 | Disposition: A | Discharge status: Home / Self Care | Payer: Medicare Other | Attending: Emergency Medicine | Admitting: Emergency Medicine

## 2022-05-26 DIAGNOSIS — U071 COVID-19: Secondary | ICD-10-CM

## 2022-05-26 DIAGNOSIS — J029 Acute pharyngitis, unspecified: Secondary | ICD-10-CM

## 2022-05-26 HISTORY — DX: Unspecified atrial fibrillation: I48.91

## 2022-05-26 HISTORY — DX: Hyperlipidemia, unspecified: E78.5

## 2022-05-26 LAB — RESP PANEL BY RT-PCR (RSV, FLU A&B, COVID)  RVPGX2
Influenza A by PCR: NEGATIVE
Influenza B by PCR: NEGATIVE
Resp Syncytial Virus by PCR: NEGATIVE
SARS Coronavirus 2 by RT PCR: POSITIVE — AB

## 2022-05-26 LAB — GROUP A STREP BY PCR: Group A Strep by PCR: NOT DETECTED

## 2022-05-26 MED ORDER — MOLNUPIRAVIR EUA 200MG CAPSULE: 4.0000 | ORAL_CAPSULE | Freq: Two times a day (BID) | ORAL | 0 refills | Status: AC

## 2022-05-26 NOTE — Discharge Instructions (Addendum)
You were diagnosed today with COVID-19.  Please take the prescribed antiviral therapy as directed.  He may take over-the-counter medication such as acetaminophen or ibuprofen as needed for fever and pain control.  If you develop any life-threatening symptoms such as severe shortness of breath, please return to the emergency department for reevaluation.

## 2022-05-26 NOTE — ED Provider Notes (Signed)
[facility] EMERGENCY DEPARTMENT Provider Note   CSN: [id number] Arrival date & time: 05/26/22  1048     History  Chief Complaint  Patient presents with   Sore Throat    Cameron Webb is a 81 y.o. male.  Patient presents emergency department complaining of 1 day of sore throat.  Denies cough, shortness of breath, headache, body aches, chills, fevers.  Past medical history significant for atrial fibrillation, hyperlipidemia  HPI     Home Medications Prior to Admission medications   Medication Sig Start Date End Date Taking? Authorizing Provider  molnupiravir EUA (LAGEVRIO) 200 mg CAPS capsule Take 4 capsules (800 mg total) by mouth 2 (two) times daily for 5 days. 05/26/22 05/31/22 Yes Darrick Grinder, PA-C  valACYclovir (VALTREX) 1000 MG tablet Take 1 tablet (1,000 mg total) by mouth 3 (three) times daily. 01/27/22   Vanetta Mulders, MD      Allergies    Patient has no known allergies.    Review of Systems   Review of Systems  HENT:  Positive for sore throat.     Physical Exam Updated Vital Signs BP (!) 154/81 (BP Location: Right Arm)   Pulse 70   Temp 99 F (37.2 C) (Oral)   Resp 16   Ht 5\' 9"  (1.753 m)   Wt 89.8 kg   SpO2 98%   BMI 29.24 kg/m  Physical Exam Vitals and nursing note reviewed.  Constitutional:      General: He is not in acute distress.    Appearance: He is well-developed.  HENT:     Head: Normocephalic and atraumatic.     Mouth/Throat:     Mouth: Mucous membranes are moist.     Pharynx: Uvula midline. Posterior oropharyngeal erythema present. No pharyngeal swelling or oropharyngeal exudate.     Tonsils: No tonsillar exudate.  Eyes:     Conjunctiva/sclera: Conjunctivae normal.  Cardiovascular:     Rate and Rhythm: Normal rate and regular rhythm.     Heart sounds: No murmur heard. Pulmonary:     Effort: Pulmonary effort is normal. No respiratory distress.     Breath sounds: Normal breath sounds.  Abdominal:      Palpations: Abdomen is soft.     Tenderness: There is no abdominal tenderness.  Musculoskeletal:        General: No swelling.     Cervical back: Neck supple.  Skin:    General: Skin is warm and dry.     Capillary Refill: Capillary refill takes less than 2 seconds.  Neurological:     Mental Status: He is alert.  Psychiatric:        Mood and Affect: Mood normal.     ED Results / Procedures / Treatments   Labs (all labs ordered are listed, but only abnormal results are displayed) Labs Reviewed  RESP PANEL BY RT-PCR (RSV, FLU A&B, COVID)  RVPGX2 - Abnormal; Notable for the following components:      Result Value   SARS Coronavirus 2 by RT PCR POSITIVE (*)    All other components within normal limits  GROUP A STREP BY PCR    EKG None  Radiology No results found.  Procedures Procedures    Medications Ordered in ED Medications - No data to display  ED Course/ Medical Decision Making/ A&P                           Medical Decision Making  Patient  presents with a chief complaint of sore throat.  Differential diagnosis includes but is not limited to COVID-19, group A strep, influenza, other viral illnesses, peritonsillar abscess, and others  I reviewed the patient's past medical history.  I saw no recent relevant past medical history  I ordered and reviewed labs.  Pertinent results include positive COVID-19 test  Patient's exam is grossly unremarkable.  There are some erythema in the throat but no exudate.  Group A strep testing was negative.  Plan to discharge patient home with prescription for antiviral medication.  There is no recent GFR on file so I will send the patient molnupiravir.  Patient instructed on supportive therapy at home.        Final Clinical Impression(s) / ED Diagnoses Final diagnoses:  Sore throat  COVID-19    Rx / DC Orders ED Discharge Orders          Ordered    molnupiravir EUA (LAGEVRIO) 200 mg CAPS capsule  2 times daily         05/26/22 1243              Pamala Duffel 05/26/22 1243    Vanetta Mulders, MD 05/28/22 1712

## 2022-05-26 NOTE — ED Triage Notes (Signed)
Patient states he woke up in the middle of the night with a sore throat, denies any runny nose or cough.

## 2022-09-19 ENCOUNTER — Encounter: Payer: Self-pay | Admitting: *Deleted

## 2022-09-21 ENCOUNTER — Inpatient Hospital Stay: Payer: Medicare HMO | Admitting: Hematology & Oncology

## 2022-09-21 ENCOUNTER — Encounter: Payer: Self-pay | Admitting: Hematology & Oncology

## 2022-09-21 ENCOUNTER — Inpatient Hospital Stay: Payer: Medicare HMO | Attending: Hematology & Oncology

## 2022-09-21 ENCOUNTER — Ambulatory Visit (HOSPITAL_BASED_OUTPATIENT_CLINIC_OR_DEPARTMENT_OTHER)
Admission: RE | Admit: 2022-09-21 | Discharge: 2022-09-21 | Disposition: A | Discharge status: Home / Self Care | Payer: Medicare HMO | Source: Ambulatory Visit | Attending: Hematology & Oncology | Admitting: Hematology & Oncology

## 2022-09-21 VITALS — BP 155/58 | HR 53 | Temp 98.2°F | Resp 20 | Ht 69.0 in | Wt 206.2 lb

## 2022-09-21 DIAGNOSIS — D472 Monoclonal gammopathy: Secondary | ICD-10-CM | POA: Insufficient documentation

## 2022-09-21 LAB — CBC WITH DIFFERENTIAL (CANCER CENTER ONLY)
Abs Immature Granulocytes: 0.02 10*3/uL (ref 0.00–0.07)
Basophils Absolute: 0.1 10*3/uL (ref 0.0–0.1)
Basophils Relative: 1 %
Eosinophils Absolute: 0.2 10*3/uL (ref 0.0–0.5)
Eosinophils Relative: 3 %
HCT: 35.7 % — ABNORMAL LOW (ref 39.0–52.0)
Hemoglobin: 12.5 g/dL — ABNORMAL LOW (ref 13.0–17.0)
Immature Granulocytes: 0 %
Lymphocytes Relative: 38 %
Lymphs Abs: 2.9 10*3/uL (ref 0.7–4.0)
MCH: 31.3 pg (ref 26.0–34.0)
MCHC: 35 g/dL (ref 30.0–36.0)
MCV: 89.5 fL (ref 80.0–100.0)
Monocytes Absolute: 0.6 10*3/uL (ref 0.1–1.0)
Monocytes Relative: 8 %
Neutro Abs: 4 10*3/uL (ref 1.7–7.7)
Neutrophils Relative %: 50 %
Platelet Count: 260 10*3/uL (ref 150–400)
RBC: 3.99 MIL/uL — ABNORMAL LOW (ref 4.22–5.81)
RDW: 12.9 % (ref 11.5–15.5)
WBC Count: 7.8 10*3/uL (ref 4.0–10.5)
nRBC: 0 % (ref 0.0–0.2)

## 2022-09-21 LAB — CMP (CANCER CENTER ONLY)
ALT: 8 U/L (ref 0–44)
AST: 12 U/L — ABNORMAL LOW (ref 15–41)
Albumin: 3.6 g/dL (ref 3.5–5.0)
Alkaline Phosphatase: 55 U/L (ref 38–126)
Anion gap: 9 (ref 5–15)
BUN: 22 mg/dL (ref 8–23)
CO2: 23 mmol/L (ref 22–32)
Calcium: 9.3 mg/dL (ref 8.9–10.3)
Chloride: 106 mmol/L (ref 98–111)
Creatinine: 1 mg/dL (ref 0.61–1.24)
GFR, Estimated: >60 mL/min (ref >60)
Glucose, Bld: 111 mg/dL — ABNORMAL HIGH (ref 70–99)
Potassium: 3.9 mmol/L (ref 3.5–5.1)
Sodium: 138 mmol/L (ref 135–145)
Total Bilirubin: 0.6 mg/dL (ref 0.3–1.2)
Total Protein: 7.2 g/dL (ref 6.5–8.1)

## 2022-09-21 LAB — LACTATE DEHYDROGENASE: LDH: 139 U/L (ref 98–192)

## 2022-09-21 LAB — SAVE SMEAR(SSMR), FOR PROVIDER SLIDE REVIEW

## 2022-09-21 NOTE — Progress Notes (Signed)
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  HPI: Cameron Webb is a very nice 82 year old white male.  He was in the service.  He was in the [employer]  He was in for 3 years.  He served in [place].  As such, he is a true American hero.  He is followed by Dr. Moshe Salisbury.  Back in January, he I think was having some issues.  He does have a history of atrial fibrillation.  He is on Eliquis.  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  As well as history of prostate cancer and he had his prostate taken out I think 20 years ago.  He apparently has seen an ophthalmologist.  I think that he had history of Bell's palsy.  This affected the right side of his face.  He said that the ophthalmologist Asked him if he knew what his hemoglobin A1c is.  It sounds like he may have had some diabetic retinopathy.  Apparently, he had that lab work that was done in March.  Again I am unsure of why he had an SPEP done.  This did show that he had a monoclonal spike that was 1.5 g/dL.  This is found to be a IgG lambda monoclonal spike.  Unfortunately, there is no other test that were done to further evaluate this monoclonal spike.  He had electrolytes.  He was not anemic.  He had no hypercalcemia.  His BUN and creatinine were 30 and 1.01, respectively.  He was calmly referred to the [facility] because this IgG lambda spike.  Again he has had psoriatic arthritis.  He is on methotrexate.  He thinks he has been on methotrexate for about 5 years.  He has had no problems infections.  He has had no weight loss or weight gain.  There is a strong history of cancer in his family.  He is she followed at the [facility] for some of his health issues.  He has had no problems with tobacco use currently.  He does not have any  alcohol use.  Overall, I was his performance status is probably ECOG 1.    Past Medical History:  Diagnosis Date   Atrial fibrillation    Hyperlipidemia   :   Past Surgical History:  Procedure Laterality Date   APPENDECTOMY     CIRCUMCISION     HERNIA REPAIR     PROSTATECTOMY     REPLACEMENT TOTAL KNEE Right    SHOULDER ARTHROSCOPY Bilateral    VASCULAR SURGERY    :   Current Outpatient Medications:    acetaminophen (TYLENOL) 500 MG tablet, Take 500 mg by mouth every 8 (eight) hours as needed., Disp: , Rfl:    apixaban (ELIQUIS) 5 MG TABS tablet, 5 mg 2 (two) times daily., Disp: , Rfl:    aspirin EC 81 MG tablet, Take 1 tablet by mouth daily., Disp: , Rfl:    Cholecalciferol 125 MCG (5000 UT) capsule, Take by mouth 2 (two) times daily., Disp: , Rfl:    cyanocobalamin (VITAMIN B12) 500 MCG tablet, Take 500 mcg by mouth daily., Disp: , Rfl:    diltiazem (CARDIZEM CD) 180 MG 24 hr capsule, Take 180 mg by mouth daily., Disp: , Rfl:  folic acid (FOLVITE) 1 MG tablet, Take 1 tablet by mouth daily., Disp: , Rfl:    losartan (COZAAR) 100 MG tablet, Take 50 mg by mouth daily., Disp: , Rfl:    lovastatin (MEVACOR) 40 MG tablet, Take 40 mg by mouth at bedtime., Disp: , Rfl:    metFORMIN (GLUCOPHAGE-XR) 500 MG 24 hr tablet, Take by mouth daily with breakfast., Disp: , Rfl:    methotrexate (RHEUMATREX) 2.5 MG tablet, Take 2.5 mg by mouth once a week., Disp: , Rfl:    metoprolol tartrate (LOPRESSOR) 25 MG tablet, 12.5 mg 2 (two) times daily., Disp: , Rfl:    sotalol (BETAPACE) 80 MG tablet, Take 80 mg by mouth 2 (two) times daily., Disp: , Rfl: :  :  No Known Allergies:  History reviewed. No pertinent family history.:   Social History   Socioeconomic History   Marital status: Married    Spouse name: Not on file   Number of children: Not on file   Years of education: Not on file   Highest education level: Not on file  Occupational History   Not on file  Tobacco Use    Smoking status: Never   Smokeless tobacco: Never  Vaping Use   Vaping Use: Never used  Substance and Sexual Activity   Alcohol use: Not Currently   Drug use: Not Currently   Sexual activity: Not Currently  Other Topics Concern   Not on file  Social History Narrative   Not on file   Social Determinants of Health   Financial Resource Strain: Low Risk  (09/21/2022)   Overall Financial Resource Strain (CARDIA)    Difficulty of Paying Living Expenses: Not hard at all  Food Insecurity: Unknown (09/21/2022)   Hunger Vital Sign    Worried About Running Out of Food in the Last Year: Not on file    Ran Out of Food in the Last Year: Never true  Transportation Needs: No Transportation Needs (09/21/2022)   PRAPARE - Administrator, Civil Service (Medical): No    Lack of Transportation (Non-Medical): No  Physical Activity: Insufficiently Active (09/21/2022)   Exercise Vital Sign    Days of Exercise per Week: 1 day    Minutes of Exercise per Session: 30 min  Stress: No Stress Concern Present (09/21/2022)   [employer] of Occupational Health - Occupational Stress Questionnaire    Feeling of Stress : Not at all  Social Connections: Socially Integrated (09/21/2022)   Social Connection and Isolation Panel [NHANES]    Frequency of Communication with Friends and Family: More than three times a week    Frequency of Social Gatherings with Friends and Family: Three times a week    Attends Religious Services: More than 4 times per year    Active Member of Clubs or Organizations: Yes    Attends Banker Meetings: More than 4 times per year    Marital Status: Married  Intimate Partner Violence: Unknown (09/21/2022)   Humiliation, Afraid, Rape, and Kick questionnaire    Fear of Current or Ex-Partner: No    Emotionally Abused: No    Physically Abused: Not on file    Sexually Abused: No  :  Review of Systems  Constitutional: Negative.   HENT: Negative.    Eyes: Negative.    Respiratory: Negative.    Cardiovascular: Negative.   Gastrointestinal: Negative.   Genitourinary: Negative.   Musculoskeletal: Negative.   Skin: Negative.   Neurological: Negative.   Endo/Heme/Allergies: Negative.  Psychiatric/Behavioral: Negative.       Exam: Vital signs are temperature 98.2.  Pulse 53.  Blood pressure 155/58.  Weight is 206 pounds.  @ Physical Exam Vitals reviewed.  HENT:     Head: Normocephalic and atraumatic.  Eyes:     Pupils: Pupils are equal, round, and reactive to light.  Cardiovascular:     Rate and Rhythm: Normal rate and regular rhythm.     Heart sounds: Normal heart sounds.     Comments: Cardiac exam is regular rate and rhythm consistent with atrial fibrillation.  The heart rate is well-controlled. Pulmonary:     Effort: Pulmonary effort is normal.     Breath sounds: Normal breath sounds.  Abdominal:     General: Bowel sounds are normal.     Palpations: Abdomen is soft.  Musculoskeletal:        General: No tenderness or deformity. Normal range of motion.     Cervical back: Normal range of motion.     Comments: There is no tenderness over his spine, ribs or hips.  Lymphadenopathy:     Cervical: No cervical adenopathy.  Skin:    General: Skin is warm and dry.     Findings: No erythema or rash.  Neurological:     Mental Status: He is alert and oriented to person, place, and time.  Psychiatric:        Behavior: Behavior normal.        Thought Content: Thought content normal.        Judgment: Judgment normal.     Recent Labs    09/21/22 1047  WBC 7.8  HGB 12.5*  HCT 35.7*  PLT 260    Recent Labs    09/21/22 1047  NA 138  K 3.9  CL 106  CO2 23  GLUCOSE 111*  BUN 22  CREATININE 1.00  CALCIUM 9.3    Blood smear review: Normochromic normocytic population of red blood cells.  There is no rouleaux formation.  I see no nucleated red blood cells.  He has no schistocytes or spherocytes.  White blood cells appear normal  in morphology maturation.  I do not see any hypersegmented polys.  He has no immature myeloid or lymphoid cells.  Platelets are adequate in number and size.  Pathology: None    Assessment and Plan: Cameron Webb is a very nice 82 year old white male.  He has an IgG lambda MGUS.  I really am not surprised that he has this given that he has a longstanding history of psoriatic arthritis.  Even, his age, could certainly increase his risk of having MGUS.  For right now, we will have to get a 24-hour urine on him.  I will have to see what this shows.  We will have to get his quantitative immunoglobulins and light chains.  We will see if these are elevated.  His blood smear looks fine.  His labs look that bad.  I do not see anything on his labs that look like he would have myeloma.  I will also get a bone survey on him to see if there is anything with the bones that look like lytic lesions.  He is fun to talk to.  He is a true [place] hero for serving our country in [place].  I told him to drink as much fluid as possible while he does the 24-hour urine collection.  I told him that he can bring in the urine collection whenever he wants to.  There is  no rush.  I will plan to get him back depending on what our labs show.

## 2022-09-22 LAB — BETA 2 MICROGLOBULIN, SERUM: Beta-2 Microglobulin: 2.5 mg/L — ABNORMAL HIGH (ref 0.6–2.4)

## 2022-09-23 LAB — IGG, IGA, IGM
IgA: 141 mg/dL (ref 61–437)
IgG (Immunoglobin G), Serum: 1942 mg/dL — ABNORMAL HIGH (ref 603–1613)
IgM (Immunoglobulin M), Srm: 36 mg/dL (ref 15–143)

## 2022-09-23 LAB — ERYTHROPOIETIN: Erythropoietin: 17.2 m[IU]/mL (ref 2.6–18.5)

## 2022-09-24 LAB — KAPPA/LAMBDA LIGHT CHAINS
Kappa free light chain: 18.9 mg/L (ref 3.3–19.4)
Kappa, lambda light chain ratio: 0.15 — ABNORMAL LOW (ref 0.26–1.65)
Lambda free light chains: 125.2 mg/L — ABNORMAL HIGH (ref 5.7–26.3)

## 2022-09-25 ENCOUNTER — Inpatient Hospital Stay: Payer: Medicare HMO

## 2022-09-25 DIAGNOSIS — D472 Monoclonal gammopathy: Secondary | ICD-10-CM | POA: Diagnosis not present

## 2022-09-26 ENCOUNTER — Telehealth: Payer: Self-pay

## 2022-09-26 NOTE — Telephone Encounter (Signed)
-----   Message from Josph Macho, MD sent at 09/25/2022  8:01 PM EDT ----- Call - the bone survey dose not show any myeloma in the bones!!!   Cindee Lame

## 2022-09-26 NOTE — Telephone Encounter (Signed)
Called and informed patient of results, patient verbalized understanding and denies any questions or concerns at this time.   

## 2022-09-27 LAB — UPEP/UIFE/LIGHT CHAINS/TP, 24-HR UR
% BETA, Urine: 10.3 %
ALPHA 1 URINE: 3.3 %
Albumin, U: 71.8 %
Alpha 2, Urine: 1.9 %
Free Kappa Lt Chains,Ur: 4.67 mg/L (ref 1.17–86.46)
Free Kappa/Lambda Ratio: 0.59 — ABNORMAL LOW (ref 1.83–14.26)
Free Lambda Lt Chains,Ur: 7.91 mg/L (ref 0.27–15.21)
GAMMA GLOBULIN URINE: 12.7 %
M-SPIKE %, Urine: 8.5 % — ABNORMAL HIGH
M-Spike, Mg/24 Hr: 42 mg/24 hr — ABNORMAL HIGH
Total Protein, Urine-Ur/day: 490 mg/24 hr — ABNORMAL HIGH (ref 30–150)
Total Protein, Urine: 14 mg/dL
Total Volume: 3500

## 2022-09-28 LAB — PROTEIN ELECTROPHORESIS, SERUM, WITH REFLEX
A/G Ratio: 1 (ref 0.7–1.7)
Albumin ELP: 3.6 g/dL (ref 2.9–4.4)
Alpha-1-Globulin: 0.2 g/dL (ref 0.0–0.4)
Alpha-2-Globulin: 0.7 g/dL (ref 0.4–1.0)
Beta Globulin: 0.8 g/dL (ref 0.7–1.3)
Gamma Globulin: 1.9 g/dL — ABNORMAL HIGH (ref 0.4–1.8)
Globulin, Total: 3.6 g/dL (ref 2.2–3.9)
M-Spike, %: 1.4 g/dL — ABNORMAL HIGH
SPEP Interpretation: 0
Total Protein ELP: 7.2 g/dL (ref 6.0–8.5)

## 2022-09-28 LAB — IMMUNOFIXATION REFLEX, SERUM
IgA: 161 mg/dL (ref 61–437)
IgG (Immunoglobin G), Serum: 2205 mg/dL — ABNORMAL HIGH (ref 603–1613)
IgM (Immunoglobulin M), Srm: 38 mg/dL (ref 15–143)

## 2022-10-02 ENCOUNTER — Telehealth: Payer: Self-pay

## 2022-10-02 ENCOUNTER — Other Ambulatory Visit: Payer: Self-pay | Admitting: *Deleted

## 2022-10-02 DIAGNOSIS — D472 Monoclonal gammopathy: Secondary | ICD-10-CM

## 2022-10-02 NOTE — Telephone Encounter (Signed)
Patient called requesting results from 24 hour urine. Discussed with MD who states 24 hour urine and labs suggest patient does have multiple myeloma and requests a bone marrow biopsy in 3 weeks and follow up with him in 4-5 weeks to discuss tx options. Order pleased, scheduling and patient informed.

## 2022-11-05 ENCOUNTER — Encounter: Payer: Self-pay | Admitting: Hematology & Oncology

## 2022-11-05 ENCOUNTER — Inpatient Hospital Stay: Payer: Medicare PPO | Attending: Hematology & Oncology

## 2022-11-05 ENCOUNTER — Inpatient Hospital Stay (HOSPITAL_BASED_OUTPATIENT_CLINIC_OR_DEPARTMENT_OTHER): Payer: Medicare PPO | Admitting: Hematology & Oncology

## 2022-11-05 VITALS — BP 149/62 | HR 56 | Temp 98.3°F | Resp 18 | Wt 204.8 lb

## 2022-11-05 DIAGNOSIS — D472 Monoclonal gammopathy: Secondary | ICD-10-CM | POA: Insufficient documentation

## 2022-11-05 DIAGNOSIS — Z79899 Other long term (current) drug therapy: Secondary | ICD-10-CM | POA: Diagnosis not present

## 2022-11-05 DIAGNOSIS — M790 Rheumatism, unspecified: Secondary | ICD-10-CM | POA: Diagnosis not present

## 2022-11-05 DIAGNOSIS — Z7901 Long term (current) use of anticoagulants: Secondary | ICD-10-CM | POA: Diagnosis not present

## 2022-11-05 DIAGNOSIS — I4891 Unspecified atrial fibrillation: Secondary | ICD-10-CM | POA: Insufficient documentation

## 2022-11-05 LAB — CMP (CANCER CENTER ONLY)
ALT: 11 U/L (ref 0–44)
AST: 13 U/L — ABNORMAL LOW (ref 15–41)
Albumin: 3.9 g/dL (ref 3.5–5.0)
Alkaline Phosphatase: 56 U/L (ref 38–126)
Anion gap: 9 (ref 5–15)
BUN: 23 mg/dL (ref 8–23)
CO2: 26 mmol/L (ref 22–32)
Calcium: 10.1 mg/dL (ref 8.9–10.3)
Chloride: 104 mmol/L (ref 98–111)
Creatinine: 1.02 mg/dL (ref 0.61–1.24)
GFR, Estimated: >60 mL/min (ref >60)
Glucose, Bld: 156 mg/dL — ABNORMAL HIGH (ref 70–99)
Potassium: 3.8 mmol/L (ref 3.5–5.1)
Sodium: 139 mmol/L (ref 135–145)
Total Bilirubin: 0.3 mg/dL (ref 0.3–1.2)
Total Protein: 7.6 g/dL (ref 6.5–8.1)

## 2022-11-05 LAB — CBC WITH DIFFERENTIAL (CANCER CENTER ONLY)
Abs Immature Granulocytes: 0.02 10*3/uL (ref 0.00–0.07)
Basophils Absolute: 0 10*3/uL (ref 0.0–0.1)
Basophils Relative: 0 %
Eosinophils Absolute: 0.3 10*3/uL (ref 0.0–0.5)
Eosinophils Relative: 4 %
HCT: 36.9 % — ABNORMAL LOW (ref 39.0–52.0)
Hemoglobin: 13 g/dL (ref 13.0–17.0)
Immature Granulocytes: 0 %
Lymphocytes Relative: 36 %
Lymphs Abs: 2.5 10*3/uL (ref 0.7–4.0)
MCH: 31.6 pg (ref 26.0–34.0)
MCHC: 35.2 g/dL (ref 30.0–36.0)
MCV: 89.6 fL (ref 80.0–100.0)
Monocytes Absolute: 0.5 10*3/uL (ref 0.1–1.0)
Monocytes Relative: 8 %
Neutro Abs: 3.6 10*3/uL (ref 1.7–7.7)
Neutrophils Relative %: 52 %
Platelet Count: 225 10*3/uL (ref 150–400)
RBC: 4.12 MIL/uL — ABNORMAL LOW (ref 4.22–5.81)
RDW: 12.3 % (ref 11.5–15.5)
WBC Count: 7 10*3/uL (ref 4.0–10.5)
nRBC: 0 % (ref 0.0–0.2)

## 2022-11-05 LAB — LACTATE DEHYDROGENASE: LDH: 115 U/L (ref 98–192)

## 2022-11-05 NOTE — Progress Notes (Signed)
Hematology and Oncology Follow Up Visit  Cameron Webb [id number] 11-11-1940 82 y.o. 11/05/2022   Principle Diagnosis:  IgG Lambda MGUS  Current Therapy:   Observation     Interim History:  [name] is back for follow-up.  This is his second office visit.  We first saw him back in April.  At that time, he came in with a IgG lambda MGUS.  When we saw him, his monoclonal spike was 1.4 g/dL.  His IgG level was 1940 mg/dL.  The lambda light chain was 12.5 mg/dL.  We did a bone survey on him.  The bone survey was negative for any obvious myelomatous lesions.  We did 24-hour urine on him.  His lambda light chain was only 7.9 mg/L.  However, there was a monoclonal lambda light chain.  For right now, I still think we can just follow him along.  Really, he is asymptomatic.  He is having no problems with infections.  There is no fever.  He has had no nausea or vomiting.  He has had no headache.  There is been no rashes.  He has had no bleeding.  Is no change in bowel or bladder habits.  He is on Eliquis for atrial fibrillation.  He is on methotrexate for rheumatism.  Overall, I would say that his performance status is probably ECOG 1.  Medications:  Current Outpatient Medications:    acetaminophen (TYLENOL) 500 MG tablet, Take 500 mg by mouth every 8 (eight) hours as needed., Disp: , Rfl:    apixaban (ELIQUIS) 5 MG TABS tablet, 5 mg 2 (two) times daily., Disp: , Rfl:    Cholecalciferol 125 MCG (5000 UT) capsule, Take by mouth 2 (two) times daily., Disp: , Rfl:    cyanocobalamin (VITAMIN B12) 500 MCG tablet, Take 500 mcg by mouth daily., Disp: , Rfl:    diltiazem (CARDIZEM CD) 180 MG 24 hr capsule, Take 180 mg by mouth daily., Disp: , Rfl:    folic acid (FOLVITE) 1 MG tablet, Take 1 tablet by mouth daily., Disp: , Rfl:    losartan (COZAAR) 100 MG tablet, Take 50 mg by mouth daily., Disp: , Rfl:    lovastatin (MEVACOR) 40 MG tablet, Take 40 mg by mouth at bedtime., Disp: , Rfl:     metFORMIN (GLUCOPHAGE-XR) 500 MG 24 hr tablet, Take by mouth daily with breakfast., Disp: , Rfl:    methotrexate (RHEUMATREX) 2.5 MG tablet, Take 2.5 mg by mouth once a week., Disp: , Rfl:    metoprolol tartrate (LOPRESSOR) 25 MG tablet, 12.5 mg 2 (two) times daily., Disp: , Rfl:    sotalol (BETAPACE) 80 MG tablet, Take 80 mg by mouth 2 (two) times daily., Disp: , Rfl:   Allergies: No Known Allergies  Past Medical History, Surgical history, Social history, and Family History were reviewed and updated.  Review of Systems: Review of Systems  Constitutional: Negative.   HENT:  Negative.    Eyes: Negative.   Respiratory: Negative.    Cardiovascular: Negative.   Gastrointestinal: Negative.   Endocrine: Negative.   Genitourinary: Negative.    Musculoskeletal: Negative.   Skin: Negative.   Neurological: Negative.   Psychiatric/Behavioral: Negative.      Physical Exam:  weight is 204 lb 12.8 oz (92.9 kg). His oral temperature is 98.3 F (36.8 C). His blood pressure is 149/62 (abnormal) and his pulse is 56 (abnormal). His respiration is 18 and oxygen saturation is 98%.   Wt Readings from Last 3 Encounters:  11/05/22 204  lb 12.8 oz (92.9 kg)  09/21/22 206 lb 2.9 oz (93.5 kg)  05/26/22 198 lb (89.8 kg)    Physical Exam Vitals reviewed.  HENT:     Head: Normocephalic and atraumatic.  Eyes:     Pupils: Pupils are equal, round, and reactive to light.  Cardiovascular:     Rate and Rhythm: Normal rate and regular rhythm.     Heart sounds: Normal heart sounds.  Pulmonary:     Effort: Pulmonary effort is normal.     Breath sounds: Normal breath sounds.  Abdominal:     General: Bowel sounds are normal.     Palpations: Abdomen is soft.  Musculoskeletal:        General: No tenderness or deformity. Normal range of motion.     Cervical back: Normal range of motion.  Lymphadenopathy:     Cervical: No cervical adenopathy.  Skin:    General: Skin is warm and dry.     Findings: No  erythema or rash.  Neurological:     Mental Status: He is alert and oriented to person, place, and time.  Psychiatric:        Behavior: Behavior normal.        Thought Content: Thought content normal.        Judgment: Judgment normal.      Lab Results  Component Value Date   WBC 7.0 11/05/2022   HGB 13.0 11/05/2022   HCT 36.9 (L) 11/05/2022   MCV 89.6 11/05/2022   PLT 225 11/05/2022     Chemistry      Component Value Date/Time   NA 139 11/05/2022 0904   K 3.8 11/05/2022 0904   CL 104 11/05/2022 0904   CO2 26 11/05/2022 0904   BUN 23 11/05/2022 0904   CREATININE 1.02 11/05/2022 0904      Component Value Date/Time   CALCIUM 10.1 11/05/2022 0904   ALKPHOS 56 11/05/2022 0904   AST 13 (L) 11/05/2022 0904   ALT 11 11/05/2022 0904   BILITOT 0.3 11/05/2022 0904      Impression and Plan: [name] is a very nice 82 year old white male.  He served our country.  He was in the [employer]  He was in [place].  Again, he has not MGUS.  I do not see anything that qualifies him as myeloma, or even smoldering myeloma.  I would like to just follow him for right now.  I think we can probably get him back after the Labor Day.  I know he will be busy this summer.  It is possible that we may have to treat him at some point.  I just do not see that we have to put him through therapy right now since his labs look okay and he is asymptomatic.   Josph Macho, MD 6/3/20249:58 AM

## 2022-11-06 LAB — IGG, IGA, IGM
IgA: 136 mg/dL (ref 61–437)
IgG (Immunoglobin G), Serum: 1908 mg/dL — ABNORMAL HIGH (ref 603–1613)
IgM (Immunoglobulin M), Srm: 35 mg/dL (ref 15–143)

## 2022-11-06 LAB — KAPPA/LAMBDA LIGHT CHAINS
Kappa free light chain: 18.8 mg/L (ref 3.3–19.4)
Kappa, lambda light chain ratio: 0.11 — ABNORMAL LOW (ref 0.26–1.65)
Lambda free light chains: 165 mg/L — ABNORMAL HIGH (ref 5.7–26.3)

## 2022-11-14 ENCOUNTER — Telehealth: Payer: Self-pay

## 2022-11-14 LAB — PROTEIN ELECTROPHORESIS, SERUM, WITH REFLEX
A/G Ratio: 0.9 (ref 0.7–1.7)
Albumin ELP: 3.3 g/dL (ref 2.9–4.4)
Alpha-1-Globulin: 0.2 g/dL (ref 0.0–0.4)
Alpha-2-Globulin: 0.8 g/dL (ref 0.4–1.0)
Beta Globulin: 0.8 g/dL (ref 0.7–1.3)
Gamma Globulin: 1.9 g/dL — ABNORMAL HIGH (ref 0.4–1.8)
Globulin, Total: 3.7 g/dL (ref 2.2–3.9)
M-Spike, %: 1.2 g/dL — ABNORMAL HIGH
SPEP Interpretation: 0
Total Protein ELP: 7 g/dL (ref 6.0–8.5)

## 2022-11-14 LAB — IMMUNOFIXATION REFLEX, SERUM
IgA: 141 mg/dL (ref 61–437)
IgG (Immunoglobin G), Serum: 1908 mg/dL — ABNORMAL HIGH (ref 603–1613)
IgM (Immunoglobulin M), Srm: 39 mg/dL (ref 15–143)

## 2022-11-14 NOTE — Telephone Encounter (Signed)
-----   Message from Josph Macho, MD sent at 11/14/2022 12:18 PM EDT ----- Please call let him know that the monoclonal protein studies are actually better.  We will continue to follow along.  Thanks.  Cindee Lame

## 2022-11-14 NOTE — Telephone Encounter (Signed)
Called and informed patient of lab results, patient verbalized understanding and denies any questions or concerns at this time.   

## 2023-02-14 ENCOUNTER — Inpatient Hospital Stay: Payer: Medicare PPO | Admitting: Hematology & Oncology

## 2023-02-14 ENCOUNTER — Inpatient Hospital Stay: Payer: Medicare PPO | Attending: Hematology & Oncology

## 2023-02-14 ENCOUNTER — Encounter: Payer: Self-pay | Admitting: Hematology & Oncology

## 2023-02-14 ENCOUNTER — Other Ambulatory Visit: Payer: Self-pay

## 2023-02-14 VITALS — BP 130/57 | HR 46 | Temp 97.7°F | Resp 19 | Ht 69.0 in | Wt 205.0 lb

## 2023-02-14 DIAGNOSIS — D472 Monoclonal gammopathy: Secondary | ICD-10-CM

## 2023-02-14 DIAGNOSIS — D8481 Immunodeficiency due to conditions classified elsewhere: Secondary | ICD-10-CM | POA: Diagnosis not present

## 2023-02-14 HISTORY — DX: Monoclonal gammopathy: D47.2

## 2023-02-14 LAB — LACTATE DEHYDROGENASE: LDH: 129 U/L (ref 98–192)

## 2023-02-14 LAB — CBC WITH DIFFERENTIAL (CANCER CENTER ONLY)
Abs Immature Granulocytes: 0.02 10*3/uL (ref 0.00–0.07)
Basophils Absolute: 0 10*3/uL (ref 0.0–0.1)
Basophils Relative: 1 %
Eosinophils Absolute: 0.2 10*3/uL (ref 0.0–0.5)
Eosinophils Relative: 2 %
HCT: 37.9 % — ABNORMAL LOW (ref 39.0–52.0)
Hemoglobin: 13 g/dL (ref 13.0–17.0)
Immature Granulocytes: 0 %
Lymphocytes Relative: 34 %
Lymphs Abs: 2.6 10*3/uL (ref 0.7–4.0)
MCH: 30.8 pg (ref 26.0–34.0)
MCHC: 34.3 g/dL (ref 30.0–36.0)
MCV: 89.8 fL (ref 80.0–100.0)
Monocytes Absolute: 0.7 10*3/uL (ref 0.1–1.0)
Monocytes Relative: 9 %
Neutro Abs: 4.3 10*3/uL (ref 1.7–7.7)
Neutrophils Relative %: 54 %
Platelet Count: 215 10*3/uL (ref 150–400)
RBC: 4.22 MIL/uL (ref 4.22–5.81)
RDW: 12.7 % (ref 11.5–15.5)
WBC Count: 7.8 10*3/uL (ref 4.0–10.5)
nRBC: 0 % (ref 0.0–0.2)

## 2023-02-14 LAB — CMP (CANCER CENTER ONLY)
ALT: 12 U/L (ref 0–44)
AST: 13 U/L — ABNORMAL LOW (ref 15–41)
Albumin: 4.1 g/dL (ref 3.5–5.0)
Alkaline Phosphatase: 57 U/L (ref 38–126)
Anion gap: 7 (ref 5–15)
BUN: 26 mg/dL — ABNORMAL HIGH (ref 8–23)
CO2: 28 mmol/L (ref 22–32)
Calcium: 10 mg/dL (ref 8.9–10.3)
Chloride: 103 mmol/L (ref 98–111)
Creatinine: 1.17 mg/dL (ref 0.61–1.24)
GFR, Estimated: >60 mL/min (ref >60)
Glucose, Bld: 127 mg/dL — ABNORMAL HIGH (ref 70–99)
Potassium: 3.8 mmol/L (ref 3.5–5.1)
Sodium: 138 mmol/L (ref 135–145)
Total Bilirubin: 0.6 mg/dL (ref 0.3–1.2)
Total Protein: 7.7 g/dL (ref 6.5–8.1)

## 2023-02-14 NOTE — Progress Notes (Signed)
Hematology and Oncology Follow Up Visit  Cameron Webb [id number] 22-May-1941 82 y.o. 02/14/2023   Principle Diagnosis:  IgG Lambda MGUS  Current Therapy:   Observation     Interim History:  Cameron Webb is back for follow-up.  So far, he is doing quite well.  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  He goes to the [place] to have this evaluated.  It sounds like he is going need to have surgery.  It is possible that he may need to have this done outside of the [place] system.  If so, I told him to make sure he calls me so I get him into see Cameron Webb.  As far as the IgG lambda MGUS goes, this really is not a problem for him.  His last monoclonal spike was 1.2 g/dL.  His IgG level was 1900 mg/dL.  The lambda light chain was 16.5 mg/dL.  He has had no problems with infections.  There has been no issues with nausea or vomiting.  He is on quite a few medications.  He is on Eliquis for atrial fibrillation.  He is on methotrexate for rheumatoid arthritis.  Currently, I would say that his performance status is probably ECOG 1.    Medications:  Current Outpatient Medications:    chlorthalidone (HYGROTON) 25 MG tablet, Take 25 mg by mouth daily., Disp: , Rfl:    acetaminophen (TYLENOL) 500 MG tablet, Take 500 mg by mouth every 8 (eight) hours as needed., Disp: , Rfl:    apixaban (ELIQUIS) 5 MG TABS tablet, 5 mg 2 (two) times daily., Disp: , Rfl:    Cholecalciferol 125 MCG (5000 UT) capsule, Take by mouth 2 (two) times daily., Disp: , Rfl:    cyanocobalamin (VITAMIN B12) 500 MCG tablet, Take 500 mcg by mouth daily., Disp: , Rfl:    diltiazem (CARDIZEM CD) 180 MG 24 hr capsule, Take 180 mg by mouth daily., Disp: , Rfl:    folic acid (FOLVITE) 1 MG tablet, Take 1 tablet by mouth daily., Disp: , Rfl:    losartan (COZAAR) 100 MG tablet, Take 50 mg by mouth daily., Disp: , Rfl:    lovastatin (MEVACOR) 40 MG tablet, Take 40 mg by mouth at bedtime., Disp: , Rfl:     metFORMIN (GLUCOPHAGE-XR) 500 MG 24 hr tablet, Take by mouth daily with breakfast., Disp: , Rfl:    metoprolol tartrate (LOPRESSOR) 25 MG tablet, 12.5 mg 2 (two) times daily., Disp: , Rfl:    sotalol (BETAPACE) 80 MG tablet, Take 80 mg by mouth 2 (two) times daily., Disp: , Rfl:   Allergies: No Known Allergies  Past Medical History, Surgical history, Social history, and Family History were reviewed and updated.  Review of Systems: Review of Systems  Constitutional: Negative.   HENT:  Negative.    Eyes: Negative.   Respiratory: Negative.    Cardiovascular: Negative.   Gastrointestinal: Negative.   Endocrine: Negative.   Genitourinary: Negative.    Musculoskeletal: Negative.   Skin: Negative.   Neurological: Negative.   Psychiatric/Behavioral: Negative.      Physical Exam:  height is 5\' 9"  (1.753 m) and weight is 205 lb (93 kg). His oral temperature is 97.7 F (36.5 C). His blood pressure is 130/57 (abnormal) and his pulse is 46 (abnormal). His respiration is 19 and oxygen saturation is 100%.   Wt Readings from Last 3 Encounters:  02/14/23 205 lb (93 kg)  11/05/22 204 lb 12.8 oz (92.9 kg)  09/21/22 206 lb 2.9 oz (93.5 kg)    Physical Exam Vitals reviewed.  HENT:     Head: Normocephalic and atraumatic.  Eyes:     Pupils: Pupils are equal, round, and reactive to light.  Cardiovascular:     Rate and Rhythm: Normal rate and regular rhythm.     Heart sounds: Normal heart sounds.  Pulmonary:     Effort: Pulmonary effort is normal.     Breath sounds: Normal breath sounds.  Abdominal:     General: Bowel sounds are normal.     Palpations: Abdomen is soft.  Musculoskeletal:        General: No tenderness or deformity. Normal range of motion.     Cervical back: Normal range of motion.  Lymphadenopathy:     Cervical: No cervical adenopathy.  Skin:    General: Skin is warm and dry.     Findings: No erythema or rash.  Neurological:     Mental Status: He is alert and oriented  to person, place, and time.  Psychiatric:        Behavior: Behavior normal.        Thought Content: Thought content normal.        Judgment: Judgment normal.      Lab Results  Component Value Date   WBC 7.8 02/14/2023   HGB 13.0 02/14/2023   HCT 37.9 (L) 02/14/2023   MCV 89.8 02/14/2023   PLT 215 02/14/2023     Chemistry      Component Value Date/Time   NA 138 02/14/2023 0909   K 3.8 02/14/2023 0909   CL 103 02/14/2023 0909   CO2 28 02/14/2023 0909   BUN 26 (H) 02/14/2023 0909   CREATININE 1.17 02/14/2023 0909      Component Value Date/Time   CALCIUM 10.0 02/14/2023 0909   ALKPHOS 57 02/14/2023 0909   AST 13 (L) 02/14/2023 0909   ALT 12 02/14/2023 0909   BILITOT 0.6 02/14/2023 0909      Impression and Plan: Cameron Webb is a very nice 82 year old white male.  He served our country.  He was in the [employer]  He was in [place].  Again, he has an IgG lambda  MGUS.  I do not see anything that qualifies him as myeloma, or even smoldering myeloma.  I would like to just follow him for right now.  Again, the problem is, he is left knee.  For right now, I would just follow him along.  I will plan to get him back after the Holidays.  Hopefully, by then, he would have had his surgery for his knee.  Again, I told him to call me if he needs surgery outside of the [place] system and I will make sure he sees Cameron Webb.   Cameron Macho, MD 9/12/202410:38 AM

## 2023-02-15 LAB — BETA 2 MICROGLOBULIN, SERUM: Beta-2 Microglobulin: 2.6 mg/L — ABNORMAL HIGH (ref 0.6–2.4)

## 2023-02-15 LAB — KAPPA/LAMBDA LIGHT CHAINS
Kappa free light chain: 21.2 mg/L — ABNORMAL HIGH (ref 3.3–19.4)
Kappa, lambda light chain ratio: 0.12 — ABNORMAL LOW (ref 0.26–1.65)
Lambda free light chains: 181.4 mg/L — ABNORMAL HIGH (ref 5.7–26.3)

## 2023-02-16 LAB — IGG, IGA, IGM
IgA: 145 mg/dL (ref 61–437)
IgG (Immunoglobin G), Serum: 2167 mg/dL — ABNORMAL HIGH (ref 603–1613)
IgM (Immunoglobulin M), Srm: 40 mg/dL (ref 15–143)

## 2023-02-20 ENCOUNTER — Encounter: Payer: Self-pay | Admitting: Hematology & Oncology

## 2023-02-22 LAB — IMMUNOFIXATION REFLEX, SERUM
IgA: 146 mg/dL (ref 61–437)
IgG (Immunoglobin G), Serum: 2256 mg/dL — ABNORMAL HIGH (ref 603–1613)
IgM (Immunoglobulin M), Srm: 41 mg/dL (ref 15–143)

## 2023-02-22 LAB — PROTEIN ELECTROPHORESIS, SERUM, WITH REFLEX
A/G Ratio: 0.9 (ref 0.7–1.7)
Albumin ELP: 3.6 g/dL (ref 2.9–4.4)
Alpha-1-Globulin: 0.2 g/dL (ref 0.0–0.4)
Alpha-2-Globulin: 0.8 g/dL (ref 0.4–1.0)
Beta Globulin: 0.9 g/dL (ref 0.7–1.3)
Gamma Globulin: 1.9 g/dL — ABNORMAL HIGH (ref 0.4–1.8)
Globulin, Total: 3.8 g/dL (ref 2.2–3.9)
M-Spike, %: 1.5 g/dL — ABNORMAL HIGH
SPEP Interpretation: 0
Total Protein ELP: 7.4 g/dL (ref 6.0–8.5)

## 2023-03-18 ENCOUNTER — Other Ambulatory Visit: Payer: Self-pay | Admitting: Radiology

## 2023-03-18 DIAGNOSIS — D472 Monoclonal gammopathy: Secondary | ICD-10-CM

## 2023-03-18 NOTE — Consult Note (Signed)
Chief Complaint: Patient was seen in consultation today for CT-guided bone marrow biopsy  Referring Physician(s): Ennever,Peter R  Supervising Physician: Ruel Favors  [facility]  History of Present Illness: Cameron Webb is an 82 y.o. male with past medical history of atrial fibrillation, hyperlipidemia, osteoarthritis and IgG lambda MGUS.  He now has rising M spike, as well as kappa free light chain and lambda free light chains.  He is scheduled today for CT-guided bone marrow biopsy to rule out myeloma.  Past Medical History:  Diagnosis Date   Atrial fibrillation (HCC)    Hyperlipidemia    MGUS (monoclonal gammopathy of unknown significance) 02/14/2023    Past Surgical History:  Procedure Laterality Date   APPENDECTOMY     CIRCUMCISION     HERNIA REPAIR     PROSTATECTOMY     REPLACEMENT TOTAL KNEE Right    SHOULDER ARTHROSCOPY Bilateral    VASCULAR SURGERY      Allergies: Patient has no known allergies.  Medications: Prior to Admission medications   Medication Sig Start Date End Date Taking? Authorizing Provider  acetaminophen (TYLENOL) 500 MG tablet Take 500 mg by mouth every 8 (eight) hours as needed.    [provider]  apixaban (ELIQUIS) 5 MG TABS tablet 5 mg 2 (two) times daily. 10/17/16   [provider]  chlorthalidone (HYGROTON) 25 MG tablet Take 25 mg by mouth daily. 12/26/22   [provider]  Cholecalciferol 125 MCG (5000 UT) capsule Take by mouth 2 (two) times daily. 07/21/22   [provider]  cyanocobalamin (VITAMIN B12) 500 MCG tablet Take 500 mcg by mouth daily. 07/22/20   [provider]  diltiazem (CARDIZEM CD) 180 MG 24 hr capsule Take 180 mg by mouth daily.    [provider]  folic acid (FOLVITE) 1 MG tablet Take 1 tablet by mouth daily. 12/27/14   [provider]  losartan (COZAAR) 100 MG tablet Take 50 mg by mouth daily. 01/23/19   [provider]   lovastatin (MEVACOR) 40 MG tablet Take 40 mg by mouth at bedtime. 03/08/22   [provider]  metFORMIN (GLUCOPHAGE-XR) 500 MG 24 hr tablet Take by mouth daily with breakfast. 07/22/20   [provider]  metoprolol tartrate (LOPRESSOR) 25 MG tablet 12.5 mg 2 (two) times daily. 11/15/16   [provider]  sotalol (BETAPACE) 80 MG tablet Take 80 mg by mouth 2 (two) times daily. 07/24/22   [provider]     No family history on file.  Social History   Socioeconomic History   Marital status: Married    Spouse name: Not on file   Number of children: Not on file   Years of education: Not on file   Highest education level: Not on file  Occupational History   Not on file  Tobacco Use   Smoking status: Never   Smokeless tobacco: Never  Vaping Use   Vaping status: Never Used  Substance and Sexual Activity   Alcohol use: Not Currently   Drug use: Not Currently   Sexual activity: Not Currently  Other Topics Concern   Not on file  Social History Narrative   Not on file   Social Determinants of Health   Financial Resource Strain: Low Risk  (09/21/2022)   Overall Financial Resource Strain (CARDIA)    Difficulty of Paying Living Expenses: Not hard at all  Food Insecurity: Low Risk  (02/19/2023)   Received from [facility]  Hunger Vital Sign    Worried About Running Out of Food in the Last Year: Never true    Ran Out of Food in the Last Year: Never true  Transportation Needs: No Transportation Needs (02/19/2023)   Received from [facility]    In the past 12 months, has lack of reliable transportation kept you from medical appointments, meetings, work or from getting things needed for daily living? : No  Physical Activity: Insufficiently Active (09/21/2022)   Exercise Vital Sign    Days of Exercise per Week: 1 day    Minutes of Exercise per Session: 30 min  Stress: No Stress Concern Present (09/21/2022)   [employer] of  Occupational Health - Occupational Stress Questionnaire    Feeling of Stress : Not at all  Social Connections: Socially Integrated (09/21/2022)   Social Connection and Isolation Panel [NHANES]    Frequency of Communication with Friends and Family: More than three times a week    Frequency of Social Gatherings with Friends and Family: Three times a week    Attends Religious Services: More than 4 times per year    Active Member of Clubs or Organizations: Yes    Attends Engineer, structural: More than 4 times per year    Marital Status: Married      Review of Systems  Vital Signs:  Code Status:   Advance Care Plan: No documents on file   Physical Exam  Imaging: No results found.  Labs:  CBC: Recent Labs    09/21/22 1047 11/05/22 0904 02/14/23 0909  WBC 7.8 7.0 7.8  HGB 12.5* 13.0 13.0  HCT 35.7* 36.9* 37.9*  PLT 260 225 215    COAGS: No results for input(s): "INR", "APTT" in the last 8760 hours.  BMP: Recent Labs    09/21/22 1047 11/05/22 0904 02/14/23 0909  NA 138 139 138  K 3.9 3.8 3.8  CL 106 104 103  CO2 23 26 28   GLUCOSE 111* 156* 127*  BUN 22 23 26*  CALCIUM 9.3 10.1 10.0  CREATININE 1.00 1.02 1.17  GFRNONAA >60 >60 >60    LIVER FUNCTION TESTS: Recent Labs    09/21/22 1047 11/05/22 0904 02/14/23 0909  BILITOT 0.6 0.3 0.6  AST 12* 13* 13*  ALT 8 11 12   ALKPHOS 55 56 57  PROT 7.2 7.6 7.7  ALBUMIN 3.6 3.9 4.1    TUMOR MARKERS: No results for input(s): "AFPTM", "CEA", "CA199", "CHROMGRNA" in the last 8760 hours.  Assessment and Plan: 82 y.o. male with past medical history of atrial fibrillation, hyperlipidemia, osteoarthritis and IgG lambda MGUS.  He now has rising M spike, as well as kappa free light chain and lambda free light chains.  He is scheduled today for CT-guided bone marrow biopsy to rule out myeloma.Risks and benefits of procedure was discussed with the patient  including, but not limited to bleeding, infection, damage  to adjacent structures or low yield requiring additional tests.  All of the questions were answered and there is agreement to proceed.  Consent signed and in chart.    Thank you for this interesting consult.  I greatly enjoyed meeting Kaian Fahs and look forward to participating in their care.  A copy of this report was sent to the requesting provider on this date.  Electronically Signed: D. Jeananne Rama, PA-C 03/18/2023, 1:25 PM   I spent a total of 20 minutes    in face to face in clinical consultation, greater than 50%  of which was counseling/coordinating care for CT-guided bone marrow biopsy

## 2023-03-19 ENCOUNTER — Ambulatory Visit (HOSPITAL_COMMUNITY)
Admission: RE | Admit: 2023-03-19 | Discharge: 2023-03-19 | Disposition: A | Discharge status: Home / Self Care | Payer: Medicare PPO | Source: Ambulatory Visit | Attending: Hematology & Oncology

## 2023-03-19 ENCOUNTER — Encounter (HOSPITAL_COMMUNITY): Payer: Self-pay

## 2023-03-19 ENCOUNTER — Other Ambulatory Visit: Payer: Self-pay

## 2023-03-19 ENCOUNTER — Ambulatory Visit (HOSPITAL_COMMUNITY)
Admission: RE | Admit: 2023-03-19 | Discharge: 2023-03-19 | Disposition: A | Discharge status: Home / Self Care | Payer: Medicare PPO | Source: Ambulatory Visit | Attending: Hematology & Oncology | Admitting: Hematology & Oncology

## 2023-03-19 DIAGNOSIS — D649 Anemia, unspecified: Secondary | ICD-10-CM | POA: Insufficient documentation

## 2023-03-19 DIAGNOSIS — C9000 Multiple myeloma not having achieved remission: Secondary | ICD-10-CM | POA: Insufficient documentation

## 2023-03-19 DIAGNOSIS — I4891 Unspecified atrial fibrillation: Secondary | ICD-10-CM | POA: Diagnosis present

## 2023-03-19 DIAGNOSIS — M1990 Unspecified osteoarthritis, unspecified site: Secondary | ICD-10-CM | POA: Insufficient documentation

## 2023-03-19 DIAGNOSIS — D472 Monoclonal gammopathy: Secondary | ICD-10-CM

## 2023-03-19 DIAGNOSIS — E785 Hyperlipidemia, unspecified: Secondary | ICD-10-CM | POA: Insufficient documentation

## 2023-03-19 HISTORY — DX: Essential (primary) hypertension: I10

## 2023-03-19 LAB — CBC WITH DIFFERENTIAL/PLATELET
Abs Immature Granulocytes: 0.03 10*3/uL (ref 0.00–0.07)
Basophils Absolute: 0.1 10*3/uL (ref 0.0–0.1)
Basophils Relative: 1 %
Eosinophils Absolute: 0.2 10*3/uL (ref 0.0–0.5)
Eosinophils Relative: 2 %
HCT: 36.7 % — ABNORMAL LOW (ref 39.0–52.0)
Hemoglobin: 12.8 g/dL — ABNORMAL LOW (ref 13.0–17.0)
Immature Granulocytes: 0 %
Lymphocytes Relative: 33 %
Lymphs Abs: 3 10*3/uL (ref 0.7–4.0)
MCH: 31.6 pg (ref 26.0–34.0)
MCHC: 34.9 g/dL (ref 30.0–36.0)
MCV: 90.6 fL (ref 80.0–100.0)
Monocytes Absolute: 0.6 10*3/uL (ref 0.1–1.0)
Monocytes Relative: 7 %
Neutro Abs: 5.2 10*3/uL (ref 1.7–7.7)
Neutrophils Relative %: 57 %
Platelets: 224 10*3/uL (ref 150–400)
RBC: 4.05 MIL/uL — ABNORMAL LOW (ref 4.22–5.81)
RDW: 12.9 % (ref 11.5–15.5)
WBC: 9.1 10*3/uL (ref 4.0–10.5)
nRBC: 0 % (ref 0.0–0.2)

## 2023-03-19 MED ORDER — SODIUM CHLORIDE 0.9 % IV SOLN: INTRAVENOUS | Status: DC

## 2023-03-19 MED ORDER — MIDAZOLAM HCL 2 MG/2ML IJ SOLN
INTRAMUSCULAR | Status: AC
  Filled 2023-03-19: qty 2

## 2023-03-19 MED ORDER — FENTANYL CITRATE (PF) 100 MCG/2ML IJ SOLN
INTRAMUSCULAR | Status: AC | PRN
  Administered 2023-03-19: 25 ug via INTRAVENOUS
  Administered 2023-03-19: 50 ug via INTRAVENOUS

## 2023-03-19 MED ORDER — FENTANYL CITRATE (PF) 100 MCG/2ML IJ SOLN
INTRAMUSCULAR | Status: AC
  Filled 2023-03-19: qty 2

## 2023-03-19 MED ORDER — MIDAZOLAM HCL 2 MG/2ML IJ SOLN
INTRAMUSCULAR | Status: AC | PRN
Start: 2023-03-19 — End: 2023-03-19
  Administered 2023-03-19: .5 mg via INTRAVENOUS
  Administered 2023-03-19: 1 mg via INTRAVENOUS

## 2023-03-19 NOTE — Procedures (Signed)
Interventional Radiology Procedure Note  Procedure: CT BM ASP AND CORE BX    Complications: None  Estimated Blood Loss:  MIN  Findings: 11 G CORE AND ASP    M. Ruel Favors, MD

## 2023-03-19 NOTE — Discharge Instructions (Signed)
Discharge Instructions:   Please call Interventional Radiology clinic [phone] with any questions or concerns.  You may remove your dressing and shower tomorrow.  Bone Marrow Aspiration and Bone Marrow Biopsy, Adult, Care After This sheet gives you information about how to care for yourself after your procedure. Your health care provider may also give you more specific instructions. If you have problems or questions, contact your health care provider. What can I expect after the procedure? After the procedure, it is common to have: Mild pain and tenderness. Swelling. Bruising. Follow these instructions at home: Puncture site care  Follow instructions from your health care provider about how to take care of the puncture site. Make sure you: Wash your hands with soap and water before and after you change your bandage (dressing). If soap and water are not available, use hand sanitizer. Change your dressing as told by your health care provider. Check your puncture site every day for signs of infection. Check for: More redness, swelling, or pain. Fluid or blood. Warmth. Pus or a bad smell. Activity Return to your normal activities as told by your health care provider. Ask your health care provider what activities are safe for you. Do not lift anything that is heavier than 10 lb (4.5 kg), or the limit that you are told, until your health care provider says that it is safe. Do not drive for 24 hours if you were given a sedative during your procedure. General instructions  Take over-the-counter and prescription medicines only as told by your health care provider. Do not take baths, swim, or use a hot tub until your health care provider approves. Ask your health care provider if you may take showers. You may only be allowed to take sponge baths. If directed, put ice on the affected area. To do this: Put ice in a plastic bag. Place a towel between your skin and the bag. Leave the ice on  for 20 minutes, 2-3 times a day. Keep all follow-up visits as told by your health care provider. This is important. Contact a health care provider if: Your pain is not controlled with medicine. You have a fever. You have more redness, swelling, or pain around the puncture site. You have fluid or blood coming from the puncture site. Your puncture site feels warm to the touch. You have pus or a bad smell coming from the puncture site. Summary After the procedure, it is common to have mild pain, tenderness, swelling, and bruising. Follow instructions from your health care provider about how to take care of the puncture site and what activities are safe for you. Take over-the-counter and prescription medicines only as told by your health care provider. Contact a health care provider if you have any signs of infection, such as fluid or blood coming from the puncture site. This information is not intended to replace advice given to you by your health care provider. Make sure you discuss any questions you have with your health care provider. Document Revised: 10/07/2018 Document Reviewed: 10/07/2018 Elsevier Patient Education  2023 Else[employer] Inc.    Moderate Conscious Sedation, Adult, Care After This sheet gives you information about how to care for yourself after your procedure. Your health care provider may also give you more specific instructions. If you have problems or questions, contact your health care provider. What can I expect after the procedure? After the procedure, it is common to have: Sleepiness for several hours. Impaired judgment for several hours. Difficulty with balance. Vomiting if you  eat too soon. Follow these instructions at home: For the time period you were told by your health care provider:   Rest. Do not participate in activities where you could fall or become injured. Do not drive or use machinery. Do not drink alcohol. Do not take sleeping pills or medicines  that cause drowsiness. Do not make important decisions or sign legal documents. Do not take care of children on your own. Eating and drinking  Follow the diet recommended by your health care provider. Drink enough fluid to keep your urine pale yellow. If you vomit: Drink water, juice, or soup when you can drink without vomiting. Make sure you have little or no nausea before eating solid foods. General instructions Take over-the-counter and prescription medicines only as told by your health care provider. Have a responsible adult stay with you for the time you are told. It is important to have someone help care for you until you are awake and alert. Do not smoke. Keep all follow-up visits as told by your health care provider. This is important. Contact a health care provider if: You are still sleepy or having trouble with balance after 24 hours. You feel light-headed. You keep feeling nauseous or you keep vomiting. You develop a rash. You have a fever. You have redness or swelling around the IV site. Get help right away if: You have trouble breathing. You have new-onset confusion at home. Summary After the procedure, it is common to feel sleepy, have impaired judgment, or feel nauseous if you eat too soon. Rest after you get home. Know the things you should not do after the procedure. Follow the diet recommended by your health care provider and drink enough fluid to keep your urine pale yellow. Get help right away if you have trouble breathing or new-onset confusion at home. This information is not intended to replace advice given to you by your health care provider. Make sure you discuss any questions you have with your health care provider. Document Revised: 09/18/2019 Document Reviewed: 04/16/2019 Elsevier Patient Education  2023 [employer].

## 2023-03-21 LAB — SURGICAL PATHOLOGY

## 2023-03-27 ENCOUNTER — Encounter (HOSPITAL_COMMUNITY): Payer: Self-pay

## 2023-03-28 ENCOUNTER — Encounter (HOSPITAL_COMMUNITY): Payer: Self-pay

## 2023-04-02 ENCOUNTER — Encounter: Payer: Self-pay | Admitting: Hematology & Oncology

## 2023-04-02 ENCOUNTER — Inpatient Hospital Stay: Payer: Medicare PPO | Attending: Hematology & Oncology

## 2023-04-02 ENCOUNTER — Other Ambulatory Visit: Payer: Self-pay

## 2023-04-02 ENCOUNTER — Inpatient Hospital Stay: Payer: Medicare PPO | Admitting: Hematology & Oncology

## 2023-04-02 VITALS — BP 142/48 | HR 48 | Temp 98.2°F | Resp 20 | Ht 69.0 in | Wt 204.4 lb

## 2023-04-02 DIAGNOSIS — D472 Monoclonal gammopathy: Secondary | ICD-10-CM | POA: Diagnosis not present

## 2023-04-02 DIAGNOSIS — C9000 Multiple myeloma not having achieved remission: Secondary | ICD-10-CM | POA: Diagnosis present

## 2023-04-02 LAB — CMP (CANCER CENTER ONLY)
ALT: 16 U/L (ref 0–44)
AST: 13 U/L — ABNORMAL LOW (ref 15–41)
Albumin: 4.3 g/dL (ref 3.5–5.0)
Alkaline Phosphatase: 65 U/L (ref 38–126)
Anion gap: 8 (ref 5–15)
BUN: 25 mg/dL — ABNORMAL HIGH (ref 8–23)
CO2: 30 mmol/L (ref 22–32)
Calcium: 10.1 mg/dL (ref 8.9–10.3)
Chloride: 100 mmol/L (ref 98–111)
Creatinine: 1.05 mg/dL (ref 0.61–1.24)
GFR, Estimated: >60 mL/min (ref >60)
Glucose, Bld: 93 mg/dL (ref 70–99)
Potassium: 3.7 mmol/L (ref 3.5–5.1)
Sodium: 138 mmol/L (ref 135–145)
Total Bilirubin: 0.5 mg/dL (ref 0.3–1.2)
Total Protein: 8.2 g/dL — ABNORMAL HIGH (ref 6.5–8.1)

## 2023-04-02 LAB — CBC WITH DIFFERENTIAL (CANCER CENTER ONLY)
Abs Immature Granulocytes: 0.03 10*3/uL (ref 0.00–0.07)
Basophils Absolute: 0 10*3/uL (ref 0.0–0.1)
Basophils Relative: 1 %
Eosinophils Absolute: 0.2 10*3/uL (ref 0.0–0.5)
Eosinophils Relative: 2 %
HCT: 38.3 % — ABNORMAL LOW (ref 39.0–52.0)
Hemoglobin: 13.3 g/dL (ref 13.0–17.0)
Immature Granulocytes: 0 %
Lymphocytes Relative: 30 %
Lymphs Abs: 2.5 10*3/uL (ref 0.7–4.0)
MCH: 30.9 pg (ref 26.0–34.0)
MCHC: 34.7 g/dL (ref 30.0–36.0)
MCV: 88.9 fL (ref 80.0–100.0)
Monocytes Absolute: 0.7 10*3/uL (ref 0.1–1.0)
Monocytes Relative: 8 %
Neutro Abs: 5 10*3/uL (ref 1.7–7.7)
Neutrophils Relative %: 59 %
Platelet Count: 254 10*3/uL (ref 150–400)
RBC: 4.31 MIL/uL (ref 4.22–5.81)
RDW: 12.8 % (ref 11.5–15.5)
WBC Count: 8.4 10*3/uL (ref 4.0–10.5)
nRBC: 0 % (ref 0.0–0.2)

## 2023-04-02 LAB — LACTATE DEHYDROGENASE: LDH: 137 U/L (ref 98–192)

## 2023-04-02 NOTE — Progress Notes (Signed)
Hematology and Oncology Follow Up Visit  Method Sater [id number] 11-19-1940 82 y.o. 04/02/2023   Principle Diagnosis:  IgG Lambda  myeloma  Current Therapy:   Observation     Interim History:  Cameron Webb is back for follow-up.  I suspect that we probably are looking at early multiple myeloma.  He did have a bone marrow biopsy done.  This was done on 03/19/2023.  The pathology report ([id number]) showed a hypercellular bone marrow with 16% plasma cells.  He does not have any cytogenetic abnormalities.  He had no abnormalities FISH panel.  We did do a 24-hour urine on him.  This would not show a lot of lambda light chain.  I think it is 7.9 mg/L.  However, on IFE there was an IgG lambda protein spike.  His last monoclonal studies showed a M spike of 1.5 g/dL.  His IgG level was 2200 mg/dL.  His lambda light chain was 18.1 mg/dL.  Again, he has myeloma but I would say this is quite early on.  I think he probably just borders  over the diagnosis of smoldering myeloma.   I really do not think that we have to treat him right now.  He really is asymptomatic.  He does not have any bony lesions on plain x-ray.  I am not yet done a PET scan or MRI.  He has had no issues with infections.  He has had a lot of problems with his left knee.  He does see the [place] doctors for this.  He has had no weight loss or weight gain.  He has had no cough or shortness of breath.  There is been a little bit of leg swelling with but this is been chronic.  Currently, I would say his performance status is probably ECOG 1.     Medications:  Current Outpatient Medications:    acetaminophen (TYLENOL) 500 MG tablet, Take 500 mg by mouth every 8 (eight) hours as needed., Disp: , Rfl:    apixaban (ELIQUIS) 5 MG TABS tablet, 5 mg 2 (two) times daily., Disp: , Rfl:    chlorthalidone (HYGROTON) 25 MG tablet, Take 25 mg by mouth daily., Disp: , Rfl:    Cholecalciferol 125 MCG (5000 UT) capsule, Take by mouth 2  (two) times daily., Disp: , Rfl:    cyanocobalamin (VITAMIN B12) 500 MCG tablet, Take 500 mcg by mouth daily., Disp: , Rfl:    diltiazem (CARDIZEM CD) 180 MG 24 hr capsule, Take 180 mg by mouth daily., Disp: , Rfl:    folic acid (FOLVITE) 1 MG tablet, Take 1 tablet by mouth daily., Disp: , Rfl:    losartan (COZAAR) 100 MG tablet, Take 50 mg by mouth daily., Disp: , Rfl:    lovastatin (MEVACOR) 40 MG tablet, Take 40 mg by mouth at bedtime., Disp: , Rfl:    metFORMIN (GLUCOPHAGE-XR) 500 MG 24 hr tablet, Take by mouth daily with breakfast., Disp: , Rfl:    metoprolol tartrate (LOPRESSOR) 25 MG tablet, 12.5 mg 2 (two) times daily., Disp: , Rfl:    sotalol (BETAPACE) 80 MG tablet, Take 80 mg by mouth 2 (two) times daily., Disp: , Rfl:   Allergies: No Known Allergies  Past Medical History, Surgical history, Social history, and Family History were reviewed and updated.  Review of Systems: Review of Systems  Constitutional: Negative.   HENT:  Negative.    Eyes: Negative.   Respiratory: Negative.    Cardiovascular: Negative.   Gastrointestinal: Negative.  Endocrine: Negative.   Genitourinary: Negative.    Musculoskeletal: Negative.   Skin: Negative.   Neurological: Negative.   Psychiatric/Behavioral: Negative.      Physical Exam:  height is 5\' 9"  (1.753 m) and weight is 204 lb 6.4 oz (92.7 kg). His oral temperature is 98.2 F (36.8 C). His blood pressure is 142/48 (abnormal) and his pulse is 48 (abnormal). His respiration is 20 and oxygen saturation is 98%.   Wt Readings from Last 3 Encounters:  04/02/23 204 lb 6.4 oz (92.7 kg)  03/19/23 195 lb (88.5 kg)  02/14/23 205 lb (93 kg)    Physical Exam Vitals reviewed.  HENT:     Head: Normocephalic and atraumatic.  Eyes:     Pupils: Pupils are equal, round, and reactive to light.  Cardiovascular:     Rate and Rhythm: Normal rate and regular rhythm.     Heart sounds: Normal heart sounds.  Pulmonary:     Effort: Pulmonary effort is  normal.     Breath sounds: Normal breath sounds.  Abdominal:     General: Bowel sounds are normal.     Palpations: Abdomen is soft.  Musculoskeletal:        General: No tenderness or deformity. Normal range of motion.     Cervical back: Normal range of motion.  Lymphadenopathy:     Cervical: No cervical adenopathy.  Skin:    General: Skin is warm and dry.     Findings: No erythema or rash.  Neurological:     Mental Status: He is alert and oriented to person, place, and time.  Psychiatric:        Behavior: Behavior normal.        Thought Content: Thought content normal.        Judgment: Judgment normal.     Lab Results  Component Value Date   WBC 8.4 04/02/2023   HGB 13.3 04/02/2023   HCT 38.3 (L) 04/02/2023   MCV 88.9 04/02/2023   PLT 254 04/02/2023     Chemistry      Component Value Date/Time   NA 138 04/02/2023 0954   K 3.7 04/02/2023 0954   CL 100 04/02/2023 0954   CO2 30 04/02/2023 0954   BUN 25 (H) 04/02/2023 0954   CREATININE 1.05 04/02/2023 0954      Component Value Date/Time   CALCIUM 10.1 04/02/2023 0954   ALKPHOS 65 04/02/2023 0954   AST 13 (L) 04/02/2023 0954   ALT 16 04/02/2023 0954   BILITOT 0.5 04/02/2023 0954      Impression and Plan: Cameron Webb is a very nice 82 year old white male.  He served our country.  He was in the [employer]  He was in [place].  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  Again, we are not going to treat him right now.  We will just follow him along.  I would like to see him back after the Holidays.  I think this would be quite reasonable.  I suspect that we probably will need to treat him at some point.  However, I just do not think that the benefits for right now outweigh the risk.  If he does have problems with his knee, he can certainly have surgery from my point of view.  I do not see that his myeloma would be a hindrance to him having surgery.   Cameron Macho,  MD 10/29/202411:33 AM

## 2023-04-03 LAB — IGG, IGA, IGM
IgA: 167 mg/dL (ref 61–437)
IgG (Immunoglobin G), Serum: 2236 mg/dL — ABNORMAL HIGH (ref 603–1613)
IgM (Immunoglobulin M), Srm: 48 mg/dL (ref 15–143)

## 2023-04-03 LAB — KAPPA/LAMBDA LIGHT CHAINS
Kappa free light chain: 19.7 mg/L — ABNORMAL HIGH (ref 3.3–19.4)
Kappa, lambda light chain ratio: 0.11 — ABNORMAL LOW (ref 0.26–1.65)
Lambda free light chains: 176 mg/L — ABNORMAL HIGH (ref 5.7–26.3)

## 2023-04-05 ENCOUNTER — Other Ambulatory Visit: Payer: Self-pay | Admitting: *Deleted

## 2023-04-05 ENCOUNTER — Telehealth: Payer: Self-pay | Admitting: *Deleted

## 2023-04-05 NOTE — Telephone Encounter (Signed)
Message received from patient requesting a referral to an ortho MD for his knee pain/discomfort.  Dr. Myna Hidalgo notified and call placed to Dr. Jerl Santos for referral.  Call placed back to patient and message left to notify pt that Dr. Myna Hidalgo has spoken with Dr. Jerl Santos regarding a referral regarding his knee pain. Instructed pt to call office back with any questions or concerns.

## 2023-04-10 LAB — IMMUNOFIXATION REFLEX, SERUM
IgA: 163 mg/dL (ref 61–437)
IgG (Immunoglobin G), Serum: 2277 mg/dL — ABNORMAL HIGH (ref 603–1613)
IgM (Immunoglobulin M), Srm: 46 mg/dL (ref 15–143)

## 2023-04-10 LAB — PROTEIN ELECTROPHORESIS, SERUM, WITH REFLEX
A/G Ratio: 0.9 (ref 0.7–1.7)
Albumin ELP: 3.5 g/dL (ref 2.9–4.4)
Alpha-1-Globulin: 0.2 g/dL (ref 0.0–0.4)
Alpha-2-Globulin: 0.8 g/dL (ref 0.4–1.0)
Beta Globulin: 0.9 g/dL (ref 0.7–1.3)
Gamma Globulin: 1.9 g/dL — ABNORMAL HIGH (ref 0.4–1.8)
Globulin, Total: 3.9 g/dL (ref 2.2–3.9)
M-Spike, %: 1.3 g/dL — ABNORMAL HIGH
SPEP Interpretation: 0
Total Protein ELP: 7.4 g/dL (ref 6.0–8.5)

## 2023-06-17 ENCOUNTER — Telehealth: Payer: Self-pay

## 2023-06-17 NOTE — Telephone Encounter (Signed)
 Rocky Sor, NP from [facility] called to inform Dr. Timmy about patient upcoming surgery (L knee replacement). She wanted to let Dr. Timmy know that the patient's calcium level is 10.9 and to confirm with Dr. Timmy that patient is clear for surgery. Dr. Timmy informed and stated that he sees no reason why the patient should not have the surgery. Information passed on Erin.

## 2023-06-18 ENCOUNTER — Inpatient Hospital Stay: Payer: Medicare PPO | Admitting: Medical Oncology

## 2023-06-18 ENCOUNTER — Encounter: Payer: Self-pay | Admitting: Medical Oncology

## 2023-06-18 ENCOUNTER — Inpatient Hospital Stay: Payer: Medicare PPO | Attending: Hematology & Oncology

## 2023-06-18 VITALS — BP 120/50 | HR 52 | Temp 98.0°F | Resp 19 | Ht 69.0 in | Wt 205.0 lb

## 2023-06-18 DIAGNOSIS — D8481 Immunodeficiency due to conditions classified elsewhere: Secondary | ICD-10-CM

## 2023-06-18 DIAGNOSIS — C9000 Multiple myeloma not having achieved remission: Secondary | ICD-10-CM | POA: Diagnosis present

## 2023-06-18 DIAGNOSIS — D472 Monoclonal gammopathy: Secondary | ICD-10-CM

## 2023-06-18 DIAGNOSIS — M25562 Pain in left knee: Secondary | ICD-10-CM | POA: Insufficient documentation

## 2023-06-18 DIAGNOSIS — G8929 Other chronic pain: Secondary | ICD-10-CM | POA: Diagnosis not present

## 2023-06-18 LAB — CBC WITH DIFFERENTIAL (CANCER CENTER ONLY)
Abs Immature Granulocytes: 0.04 10*3/uL (ref 0.00–0.07)
Basophils Absolute: 0.1 10*3/uL (ref 0.0–0.1)
Basophils Relative: 1 %
Eosinophils Absolute: 0.2 10*3/uL (ref 0.0–0.5)
Eosinophils Relative: 2 %
HCT: 38.4 % — ABNORMAL LOW (ref 39.0–52.0)
Hemoglobin: 13.3 g/dL (ref 13.0–17.0)
Immature Granulocytes: 0 %
Lymphocytes Relative: 30 %
Lymphs Abs: 2.7 10*3/uL (ref 0.7–4.0)
MCH: 31.4 pg (ref 26.0–34.0)
MCHC: 34.6 g/dL (ref 30.0–36.0)
MCV: 90.8 fL (ref 80.0–100.0)
Monocytes Absolute: 0.7 10*3/uL (ref 0.1–1.0)
Monocytes Relative: 8 %
Neutro Abs: 5.3 10*3/uL (ref 1.7–7.7)
Neutrophils Relative %: 59 %
Platelet Count: 261 10*3/uL (ref 150–400)
RBC: 4.23 MIL/uL (ref 4.22–5.81)
RDW: 12.5 % (ref 11.5–15.5)
WBC Count: 9 10*3/uL (ref 4.0–10.5)
nRBC: 0 % (ref 0.0–0.2)

## 2023-06-18 LAB — CMP (CANCER CENTER ONLY)
ALT: 27 U/L (ref 0–44)
AST: 19 U/L (ref 15–41)
Albumin: 4 g/dL (ref 3.5–5.0)
Alkaline Phosphatase: 59 U/L (ref 38–126)
Anion gap: 7 (ref 5–15)
BUN: 22 mg/dL (ref 8–23)
CO2: 29 mmol/L (ref 22–32)
Calcium: 10 mg/dL (ref 8.9–10.3)
Chloride: 102 mmol/L (ref 98–111)
Creatinine: 1.1 mg/dL (ref 0.61–1.24)
GFR, Estimated: >60 mL/min (ref >60)
Glucose, Bld: 115 mg/dL — ABNORMAL HIGH (ref 70–99)
Potassium: 3.7 mmol/L (ref 3.5–5.1)
Sodium: 138 mmol/L (ref 135–145)
Total Bilirubin: 0.5 mg/dL (ref 0.0–1.2)
Total Protein: 8 g/dL (ref 6.5–8.1)

## 2023-06-18 LAB — LACTATE DEHYDROGENASE: LDH: 132 U/L (ref 98–192)

## 2023-06-18 NOTE — Progress Notes (Signed)
 Hematology and Oncology Follow Up Visit  Lucille Crichlow [id number] 02-01-1941 83 y.o. 06/18/2023   Principle Diagnosis:  IgG Lambda  myeloma  Current Therapy:   Observation     Interim History:  Mr. Knaak is back for follow-up.  Per Dr. 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  He did have a bone marrow biopsy done on 03/19/2023.  The pathology report ([id number]) showed a hypercellular bone marrow with 16% plasma cells.  He does not have any cytogenetic abnormalities.  He had no abnormalities FISH panel.  We did do a 24-hour urine on him which did not show a lot of lambda light chain-7.9 mg/L but on IFE there was an IgG lambda protein spike.  His last monoclonal studies showed a M spike of 1.3 g/dL (down from 1.5 2 months prior).  His IgG level was 2,277 mg/dL.  His lambda light chain was 17.6 mg/dL.  No new bone pains, No CKD, no new anemia. No recent or recurrent infections.   He is having his left knee replacement in 2 weeks at [facility]. He is excited for this.   He does have chronic left knee pain for which he is followed by the [place].   He has had no weight loss or weight gain.  He has had no cough or shortness of breath.  There is been a little bit of leg swelling with but this is been chronic.  Currently, I would say his performance status is probably ECOG 1.    Wt Readings from Last 3 Encounters:  06/18/23 205 lb (93 kg)  04/02/23 204 lb 6.4 oz (92.7 kg)  03/19/23 195 lb (88.5 kg)     Medications:  Current Outpatient Medications:    apixaban (ELIQUIS) 5 MG TABS tablet, 5 mg 2 (two) times daily., Disp: , Rfl:    chlorthalidone (HYGROTON) 25 MG tablet, Take 25 mg by mouth daily., Disp: , Rfl:    Cholecalciferol 125 MCG (5000 UT) capsule, Take by mouth 2 (two) times daily., Disp: , Rfl:    cyanocobalamin (VITAMIN B12) 500 MCG tablet, Take 500 mcg by mouth daily., Disp: , Rfl:    diltiazem (CARDIZEM CD) 180 MG 24 hr capsule, Take 180 mg by mouth daily., Disp: ,  Rfl:    folic acid (FOLVITE) 1 MG tablet, Take 1 tablet by mouth daily., Disp: , Rfl:    losartan (COZAAR) 100 MG tablet, Take 100 mg by mouth daily., Disp: , Rfl:    lovastatin (MEVACOR) 40 MG tablet, Take 40 mg by mouth at bedtime., Disp: , Rfl:    metFORMIN (GLUCOPHAGE-XR) 500 MG 24 hr tablet, Take by mouth daily with breakfast., Disp: , Rfl:    sotalol (BETAPACE) 80 MG tablet, Take 80 mg by mouth 2 (two) times daily., Disp: , Rfl:   Allergies: No Known Allergies  Past Medical History, Surgical history, Social history, and Family History were reviewed and updated.  Review of Systems: Review of Systems  Constitutional: Negative.   HENT:  Negative.    Eyes: Negative.   Respiratory: Negative.    Cardiovascular: Negative.   Gastrointestinal: Negative.   Endocrine: Negative.   Genitourinary: Negative.    Musculoskeletal: Negative.   Skin: Negative.   Neurological: Negative.   Psychiatric/Behavioral: Negative.      Physical Exam:  height is 5' 9 (1.753 m) and weight is 205 lb (93 kg). His oral temperature is 98 F (36.7 C). His blood pressure is 120/50 (abnormal) and his pulse is 52 (abnormal). His respiration  is 19 and oxygen saturation is 100%.   Wt Readings from Last 3 Encounters:  06/18/23 205 lb (93 kg)  04/02/23 204 lb 6.4 oz (92.7 kg)  03/19/23 195 lb (88.5 kg)    Physical Exam Vitals reviewed.  HENT:     Head: Normocephalic and atraumatic.  Eyes:     Pupils: Pupils are equal, round, and reactive to light.  Cardiovascular:     Rate and Rhythm: Normal rate and regular rhythm.     Heart sounds: Normal heart sounds.  Pulmonary:     Effort: Pulmonary effort is normal.     Breath sounds: Normal breath sounds.  Abdominal:     General: Bowel sounds are normal.     Palpations: Abdomen is soft.  Musculoskeletal:        General: No tenderness or deformity. Normal range of motion.     Cervical back: Normal range of motion.  Lymphadenopathy:     Cervical: No cervical  adenopathy.  Skin:    General: Skin is warm and dry.     Findings: No erythema or rash.  Neurological:     Mental Status: He is alert and oriented to person, place, and time.  Psychiatric:        Behavior: Behavior normal.        Thought Content: Thought content normal.        Judgment: Judgment normal.      Lab Results  Component Value Date   WBC 9.0 06/18/2023   HGB 13.3 06/18/2023   HCT 38.4 (L) 06/18/2023   MCV 90.8 06/18/2023   PLT 261 06/18/2023     Chemistry      Component Value Date/Time   NA 138 06/18/2023 1005   K 3.7 06/18/2023 1005   CL 102 06/18/2023 1005   CO2 29 06/18/2023 1005   BUN 22 06/18/2023 1005   CREATININE 1.10 06/18/2023 1005      Component Value Date/Time   CALCIUM 10.0 06/18/2023 1005   ALKPHOS 59 06/18/2023 1005   AST 19 06/18/2023 1005   ALT 27 06/18/2023 1005   BILITOT 0.5 06/18/2023 1005     Encounter Diagnoses  Name Primary?   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 Berg is a very nice 83 year old white male.  He served our country.  He was in the [employer]  He was in [place] with suspected agent orange exposure.   Per Dr. 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 Fortunately he is asymptomatic. He has no CKD. No known osseous lesions. No anemia. Currently the plan is to observe him closely. Fortunately his values have improved a bit over the past few months. Current monoclonal labs ar pending.   RTC 2 months MD, labs (CBC w/, CMP, LDH, SPEP, Beta 2 microglobulin, kappa light chains, IgG/IgA/IgM).    Lauraine CHRISTELLA Dais, PA-C 1/14/20253:49 PM

## 2023-06-19 LAB — IGG, IGA, IGM
IgA: 161 mg/dL (ref 61–437)
IgG (Immunoglobin G), Serum: 2240 mg/dL — ABNORMAL HIGH (ref 603–1613)
IgM (Immunoglobulin M), Srm: 40 mg/dL (ref 15–143)

## 2023-06-19 LAB — KAPPA/LAMBDA LIGHT CHAINS
Kappa free light chain: 17.9 mg/L (ref 3.3–19.4)
Kappa, lambda light chain ratio: 0.13 — ABNORMAL LOW (ref 0.26–1.65)
Lambda free light chains: 133.2 mg/L — ABNORMAL HIGH (ref 5.7–26.3)

## 2023-06-19 LAB — BETA 2 MICROGLOBULIN, SERUM: Beta-2 Microglobulin: 3 mg/L — ABNORMAL HIGH (ref 0.6–2.4)

## 2023-06-27 ENCOUNTER — Encounter: Payer: Self-pay | Admitting: *Deleted

## 2023-06-27 LAB — PROTEIN ELECTROPHORESIS, SERUM, WITH REFLEX
A/G Ratio: 0.8 (ref 0.7–1.7)
Albumin ELP: 3.5 g/dL (ref 2.9–4.4)
Alpha-1-Globulin: 0.2 g/dL (ref 0.0–0.4)
Alpha-2-Globulin: 0.9 g/dL (ref 0.4–1.0)
Beta Globulin: 0.9 g/dL (ref 0.7–1.3)
Gamma Globulin: 2.1 g/dL — ABNORMAL HIGH (ref 0.4–1.8)
Globulin, Total: 4.2 g/dL — ABNORMAL HIGH (ref 2.2–3.9)
M-Spike, %: 1.6 g/dL — ABNORMAL HIGH
SPEP Interpretation: 0
Total Protein ELP: 7.7 g/dL (ref 6.0–8.5)

## 2023-06-27 LAB — IMMUNOFIXATION REFLEX, SERUM
IgA: 212 mg/dL (ref 61–437)
IgG (Immunoglobin G), Serum: 2967 mg/dL — ABNORMAL HIGH (ref 603–1613)
IgM (Immunoglobulin M), Srm: 53 mg/dL (ref 15–143)

## 2023-06-27 NOTE — Progress Notes (Signed)
This nurse faxed paperwork to [facility] [place]. Faxed to Heriberto Antigua, CMA. Fax # is [phone]. Received faxed confirmation.

## 2023-08-13 ENCOUNTER — Inpatient Hospital Stay (HOSPITAL_BASED_OUTPATIENT_CLINIC_OR_DEPARTMENT_OTHER): Payer: Medicare PPO | Admitting: Hematology & Oncology

## 2023-08-13 ENCOUNTER — Other Ambulatory Visit: Payer: Self-pay

## 2023-08-13 ENCOUNTER — Inpatient Hospital Stay: Payer: Medicare PPO | Attending: Hematology & Oncology

## 2023-08-13 VITALS — BP 113/52 | HR 54 | Temp 98.1°F | Resp 17 | Ht 69.0 in | Wt 204.0 lb

## 2023-08-13 DIAGNOSIS — D472 Monoclonal gammopathy: Secondary | ICD-10-CM | POA: Diagnosis not present

## 2023-08-13 DIAGNOSIS — C9000 Multiple myeloma not having achieved remission: Secondary | ICD-10-CM | POA: Insufficient documentation

## 2023-08-13 LAB — CMP (CANCER CENTER ONLY)
ALT: 14 U/L (ref 0–44)
AST: 13 U/L — ABNORMAL LOW (ref 15–41)
Albumin: 3.9 g/dL (ref 3.5–5.0)
Alkaline Phosphatase: 67 U/L (ref 38–126)
Anion gap: 8 (ref 5–15)
BUN: 30 mg/dL — ABNORMAL HIGH (ref 8–23)
CO2: 29 mmol/L (ref 22–32)
Calcium: 9.8 mg/dL (ref 8.9–10.3)
Chloride: 103 mmol/L (ref 98–111)
Creatinine: 1.19 mg/dL (ref 0.61–1.24)
GFR, Estimated: >60 mL/min (ref >60)
Glucose, Bld: 113 mg/dL — ABNORMAL HIGH (ref 70–99)
Potassium: 3.6 mmol/L (ref 3.5–5.1)
Sodium: 140 mmol/L (ref 135–145)
Total Bilirubin: 0.5 mg/dL (ref 0.0–1.2)
Total Protein: 8 g/dL (ref 6.5–8.1)

## 2023-08-13 LAB — CBC WITH DIFFERENTIAL (CANCER CENTER ONLY)
Abs Immature Granulocytes: 0.03 10*3/uL (ref 0.00–0.07)
Basophils Absolute: 0 10*3/uL (ref 0.0–0.1)
Basophils Relative: 1 %
Eosinophils Absolute: 0.2 10*3/uL (ref 0.0–0.5)
Eosinophils Relative: 2 %
HCT: 35.6 % — ABNORMAL LOW (ref 39.0–52.0)
Hemoglobin: 12.1 g/dL — ABNORMAL LOW (ref 13.0–17.0)
Immature Granulocytes: 0 %
Lymphocytes Relative: 33 %
Lymphs Abs: 2.5 10*3/uL (ref 0.7–4.0)
MCH: 30.9 pg (ref 26.0–34.0)
MCHC: 34 g/dL (ref 30.0–36.0)
MCV: 90.8 fL (ref 80.0–100.0)
Monocytes Absolute: 0.7 10*3/uL (ref 0.1–1.0)
Monocytes Relative: 9 %
Neutro Abs: 4.2 10*3/uL (ref 1.7–7.7)
Neutrophils Relative %: 55 %
Platelet Count: 231 10*3/uL (ref 150–400)
RBC: 3.92 MIL/uL — ABNORMAL LOW (ref 4.22–5.81)
RDW: 12.7 % (ref 11.5–15.5)
WBC Count: 7.7 10*3/uL (ref 4.0–10.5)
nRBC: 0 % (ref 0.0–0.2)

## 2023-08-13 LAB — LACTATE DEHYDROGENASE: LDH: 125 U/L (ref 98–192)

## 2023-08-13 NOTE — Progress Notes (Signed)
 Hematology and Oncology Follow Up Visit  Talbot Monarch [id number] 01-Apr-1941 83 y.o. 08/13/2023   Principle Diagnosis:  IgG Lambda  myeloma -- nl cytogenetics/FISH  Current Therapy:   Observation     Interim History:  Mr. Holzhauer is back for follow-up.  So far, here things going pretty well.  He underwent surgery for his left knee on January 28.  He is recovering from that.  He has had a slow but steady recovery.  He does have decent range of motion of the knee.  He has had no problem with infections.  There has been no bleeding.  He has had no change in bowel or bladder habits.  There has been no cough or shortness of breath.  He has had no problems with COVID.  The last saw him, his monoclonal spike was 1.6 g/dL.  His IgG level was 2600 mg/dL.  The lambda light chain was 13.3 mg/dL.  Currently, I would have to say that his performance status is ECOG 1.     Wt Readings from Last 3 Encounters:  06/18/23 205 lb (93 kg)  04/02/23 204 lb 6.4 oz (92.7 kg)  03/19/23 195 lb (88.5 kg)     Medications:  Current Outpatient Medications:    apixaban (ELIQUIS) 5 MG TABS tablet, 5 mg 2 (two) times daily., Disp: , Rfl:    chlorthalidone (HYGROTON) 25 MG tablet, Take 25 mg by mouth daily., Disp: , Rfl:    Cholecalciferol 125 MCG (5000 UT) capsule, Take by mouth 2 (two) times daily., Disp: , Rfl:    cyanocobalamin (VITAMIN B12) 500 MCG tablet, Take 500 mcg by mouth daily., Disp: , Rfl:    diltiazem (CARDIZEM CD) 180 MG 24 hr capsule, Take 180 mg by mouth daily., Disp: , Rfl:    folic acid (FOLVITE) 1 MG tablet, Take 1 tablet by mouth daily., Disp: , Rfl:    losartan (COZAAR) 100 MG tablet, Take 100 mg by mouth daily., Disp: , Rfl:    lovastatin (MEVACOR) 40 MG tablet, Take 40 mg by mouth at bedtime., Disp: , Rfl:    metFORMIN (GLUCOPHAGE-XR) 500 MG 24 hr tablet, Take by mouth daily with breakfast., Disp: , Rfl:    sotalol (BETAPACE) 80 MG tablet, Take 80 mg by mouth 2 (two) times daily.,  Disp: , Rfl:   Allergies: No Known Allergies  Past Medical History, Surgical history, Social history, and Family History were reviewed and updated.  Review of Systems: Review of Systems  Constitutional: Negative.   HENT:  Negative.    Eyes: Negative.   Respiratory: Negative.    Cardiovascular: Negative.   Gastrointestinal: Negative.   Endocrine: Negative.   Genitourinary: Negative.    Musculoskeletal: Negative.   Skin: Negative.   Neurological: Negative.   Psychiatric/Behavioral: Negative.      Physical Exam:  Vital signs show temperature 98.1.  Pulse 54.  Blood pressure 113/52.  Weight is 204 pounds.  Wt Readings from Last 3 Encounters:  06/18/23 205 lb (93 kg)  04/02/23 204 lb 6.4 oz (92.7 kg)  03/19/23 195 lb (88.5 kg)    Physical Exam Vitals reviewed.  HENT:     Head: Normocephalic and atraumatic.  Eyes:     Pupils: Pupils are equal, round, and reactive to light.  Cardiovascular:     Rate and Rhythm: Normal rate and regular rhythm.     Heart sounds: Normal heart sounds.  Pulmonary:     Effort: Pulmonary effort is normal.     Breath sounds:  Normal breath sounds.  Abdominal:     General: Bowel sounds are normal.     Palpations: Abdomen is soft.  Musculoskeletal:        General: No tenderness or deformity. Normal range of motion.     Cervical back: Normal range of motion.     Comments: Has a little bit of swelling with the left knee.  This is still postop.  Has a healing surgical scar on the left knee.  He has little bit of warmth and a slightly bit of erythema.  Lymphadenopathy:     Cervical: No cervical adenopathy.  Skin:    General: Skin is warm and dry.     Findings: No erythema or rash.  Neurological:     Mental Status: He is alert and oriented to person, place, and time.  Psychiatric:        Behavior: Behavior normal.        Thought Content: Thought content normal.        Judgment: Judgment normal.      Lab Results  Component Value Date   WBC  7.7 08/13/2023   HGB 12.1 (L) 08/13/2023   HCT 35.6 (L) 08/13/2023   MCV 90.8 08/13/2023   PLT 231 08/13/2023     Chemistry      Component Value Date/Time   NA 140 08/13/2023 0929   K 3.6 08/13/2023 0929   CL 103 08/13/2023 0929   CO2 29 08/13/2023 0929   BUN 30 (H) 08/13/2023 0929   CREATININE 1.19 08/13/2023 0929      Component Value Date/Time   CALCIUM 9.8 08/13/2023 0929   ALKPHOS 67 08/13/2023 0929   AST 13 (L) 08/13/2023 0929   ALT 14 08/13/2023 0929   BILITOT 0.5 08/13/2023 0929       Impression and Plan: Mr. Minkin is a very nice 83 year old white male.  He served our country.  He was in the [employer]  He was in [place] with suspected agent orange exposure.   So far, I think everything is doing pretty well with respect to the myeloma.  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  I just do not see a need that we have to treat him right now.  I am just going to follow him along.  We will plan to get him back probably after [facility] day.  I think this would be reasonable.  Hopefully, by then, his left knee will be doing a lot better.   Josph Macho, MD 3/11/202510:58 AM

## 2023-08-14 LAB — KAPPA/LAMBDA LIGHT CHAINS
Kappa free light chain: 19.5 mg/L — ABNORMAL HIGH (ref 3.3–19.4)
Kappa, lambda light chain ratio: 0.08 — ABNORMAL LOW (ref 0.26–1.65)
Lambda free light chains: 247.2 mg/L — ABNORMAL HIGH (ref 5.7–26.3)

## 2023-08-14 LAB — BETA 2 MICROGLOBULIN, SERUM: Beta-2 Microglobulin: 3.1 mg/L — ABNORMAL HIGH (ref 0.6–2.4)

## 2023-08-14 LAB — IGG, IGA, IGM
IgA: 165 mg/dL (ref 61–437)
IgG (Immunoglobin G), Serum: 2444 mg/dL — ABNORMAL HIGH (ref 603–1613)
IgM (Immunoglobulin M), Srm: 45 mg/dL (ref 15–143)

## 2023-08-15 LAB — MULTIPLE MYELOMA PANEL, SERUM
Albumin SerPl Elph-Mcnc: 3.4 g/dL (ref 2.9–4.4)
Albumin/Glob SerPl: 0.9 (ref 0.7–1.7)
Alpha 1: 0.3 g/dL (ref 0.0–0.4)
Alpha2 Glob SerPl Elph-Mcnc: 0.8 g/dL (ref 0.4–1.0)
B-Globulin SerPl Elph-Mcnc: 0.9 g/dL (ref 0.7–1.3)
Gamma Glob SerPl Elph-Mcnc: 2.1 g/dL — ABNORMAL HIGH (ref 0.4–1.8)
Globulin, Total: 4.1 g/dL — ABNORMAL HIGH (ref 2.2–3.9)
IgA: 153 mg/dL (ref 61–437)
IgG (Immunoglobin G), Serum: 2305 mg/dL — ABNORMAL HIGH (ref 603–1613)
IgM (Immunoglobulin M), Srm: 40 mg/dL (ref 15–143)
M Protein SerPl Elph-Mcnc: 1.6 g/dL — ABNORMAL HIGH
Total Protein ELP: 7.5 g/dL (ref 6.0–8.5)

## 2023-08-16 ENCOUNTER — Encounter: Payer: Self-pay | Admitting: *Deleted

## 2023-10-14 ENCOUNTER — Inpatient Hospital Stay: Attending: Hematology & Oncology

## 2023-10-14 ENCOUNTER — Inpatient Hospital Stay: Admitting: Hematology & Oncology

## 2023-10-14 ENCOUNTER — Telehealth: Payer: Self-pay | Admitting: *Deleted

## 2023-10-14 ENCOUNTER — Ambulatory Visit (HOSPITAL_BASED_OUTPATIENT_CLINIC_OR_DEPARTMENT_OTHER)
Admission: RE | Admit: 2023-10-14 | Discharge: 2023-10-14 | Disposition: A | Discharge status: Home / Self Care | Source: Ambulatory Visit | Attending: Hematology & Oncology | Admitting: Hematology & Oncology

## 2023-10-14 ENCOUNTER — Other Ambulatory Visit: Payer: Self-pay

## 2023-10-14 VITALS — BP 111/66 | HR 54 | Temp 98.8°F | Resp 18 | Ht 69.0 in | Wt 206.0 lb

## 2023-10-14 DIAGNOSIS — D472 Monoclonal gammopathy: Secondary | ICD-10-CM | POA: Insufficient documentation

## 2023-10-14 DIAGNOSIS — M25551 Pain in right hip: Secondary | ICD-10-CM | POA: Insufficient documentation

## 2023-10-14 DIAGNOSIS — C9000 Multiple myeloma not having achieved remission: Secondary | ICD-10-CM | POA: Diagnosis present

## 2023-10-14 LAB — CBC WITH DIFFERENTIAL (CANCER CENTER ONLY)
Abs Immature Granulocytes: 0.04 10*3/uL (ref 0.00–0.07)
Basophils Absolute: 0.1 10*3/uL (ref 0.0–0.1)
Basophils Relative: 1 %
Eosinophils Absolute: 0.2 10*3/uL (ref 0.0–0.5)
Eosinophils Relative: 2 %
HCT: 35.2 % — ABNORMAL LOW (ref 39.0–52.0)
Hemoglobin: 12.2 g/dL — ABNORMAL LOW (ref 13.0–17.0)
Immature Granulocytes: 0 %
Lymphocytes Relative: 25 %
Lymphs Abs: 2.3 10*3/uL (ref 0.7–4.0)
MCH: 30.3 pg (ref 26.0–34.0)
MCHC: 34.7 g/dL (ref 30.0–36.0)
MCV: 87.3 fL (ref 80.0–100.0)
Monocytes Absolute: 0.7 10*3/uL (ref 0.1–1.0)
Monocytes Relative: 8 %
Neutro Abs: 5.8 10*3/uL (ref 1.7–7.7)
Neutrophils Relative %: 64 %
Platelet Count: 197 10*3/uL (ref 150–400)
RBC: 4.03 MIL/uL — ABNORMAL LOW (ref 4.22–5.81)
RDW: 13 % (ref 11.5–15.5)
WBC Count: 9.1 10*3/uL (ref 4.0–10.5)
nRBC: 0 % (ref 0.0–0.2)

## 2023-10-14 LAB — CMP (CANCER CENTER ONLY)
ALT: 19 U/L (ref 0–44)
AST: 11 U/L — ABNORMAL LOW (ref 15–41)
Albumin: 3.8 g/dL (ref 3.5–5.0)
Alkaline Phosphatase: 64 U/L (ref 38–126)
Anion gap: 9 (ref 5–15)
BUN: 24 mg/dL — ABNORMAL HIGH (ref 8–23)
CO2: 29 mmol/L (ref 22–32)
Calcium: 9.9 mg/dL (ref 8.9–10.3)
Chloride: 101 mmol/L (ref 98–111)
Creatinine: 1.11 mg/dL (ref 0.61–1.24)
GFR, Estimated: >60 mL/min (ref >60)
Glucose, Bld: 142 mg/dL — ABNORMAL HIGH (ref 70–99)
Potassium: 3.4 mmol/L — ABNORMAL LOW (ref 3.5–5.1)
Sodium: 139 mmol/L (ref 135–145)
Total Bilirubin: 0.5 mg/dL (ref 0.0–1.2)
Total Protein: 7.4 g/dL (ref 6.5–8.1)

## 2023-10-14 LAB — LACTATE DEHYDROGENASE: LDH: 119 U/L (ref 98–192)

## 2023-10-14 NOTE — Telephone Encounter (Signed)
 Returned patient's phone call and he stated,"when I am lying on my right side in bed at night, I have a sharp pain that is big as my hand. When I roll on my back, the pain goes to the middle of my back. My right hip hurts too." Per Dr. Maria Shiner, I can see him today at 1pm. LOS sent to scheduling. He verbalized understanding.

## 2023-10-14 NOTE — Progress Notes (Signed)
 Hematology and Oncology Follow Up Visit  Alberth Feeback [id number] Apr 14, 1941 83 y.o. 10/14/2023   Principle Diagnosis:  IgG Lambda  myeloma -- nl cytogenetics/FISH  Current Therapy:   Observation     Interim History:  [name] is back for an early follow-up.  He came in today saying he was having some abdominal pain and right hip pain.  The right hip pain have been going on for about 3 to 4 weeks.  Of note, about a year ago he had a bone survey which did not show any evidence of myelomatous bone disease.  He did have knee surgery for the right knee a few months ago.  I do not know if this hip pain may be somehow related to this.  I will go ahead and get a x-ray of the right hip to see everything looks..  This abdominal pain is unusual.  It only comes at nighttime.  It is not related to urinating.  Is not related to bowel movements.  There is no nausea or vomiting.  He has had no weight loss or weight gain.  There is no fever.  He has had no bleeding.  We will going get a CT scan to see what might be going on.  When we last saw him, his myeloma studies were pretty stable.  The monoclonal spike was 1.6 g/dL.  His IgG level was 2380 milligrams per deciliter.  The lambda light chain was 24.7 mg/dL.  Overall, I would have to say that his performance status about ECOG 1.      Wt Readings from Last 3 Encounters:  08/13/23 204 lb (92.5 kg)  06/18/23 205 lb (93 kg)  04/02/23 204 lb 6.4 oz (92.7 kg)     Medications:  Current Outpatient Medications:    ketoconazole (NIZORAL) 2 % shampoo, Apply 1 Application topically as needed., Disp: , Rfl:    apixaban (ELIQUIS) 5 MG TABS tablet, 5 mg 2 (two) times daily., Disp: , Rfl:    chlorthalidone (HYGROTON) 25 MG tablet, Take 25 mg by mouth daily., Disp: , Rfl:    Cholecalciferol 125 MCG (5000 UT) capsule, Take by mouth 2 (two) times daily., Disp: , Rfl:    cyanocobalamin (VITAMIN B12) 500 MCG tablet, Take 500 mcg by mouth daily., Disp: , Rfl:     diltiazem (CARDIZEM CD) 180 MG 24 hr capsule, Take 180 mg by mouth daily., Disp: , Rfl:    folic acid (FOLVITE) 1 MG tablet, Take 1 tablet by mouth daily., Disp: , Rfl:    losartan (COZAAR) 100 MG tablet, Take 100 mg by mouth daily., Disp: , Rfl:    lovastatin (MEVACOR) 40 MG tablet, Take 40 mg by mouth at bedtime., Disp: , Rfl:    metFORMIN (GLUCOPHAGE-XR) 500 MG 24 hr tablet, Take by mouth daily with breakfast., Disp: , Rfl:    sotalol (BETAPACE) 80 MG tablet, Take 80 mg by mouth 2 (two) times daily., Disp: , Rfl:   Allergies: No Known Allergies  Past Medical History, Surgical history, Social history, and Family History were reviewed and updated.  Review of Systems: Review of Systems  Constitutional: Negative.   HENT:  Negative.    Eyes: Negative.   Respiratory: Negative.    Cardiovascular: Negative.   Gastrointestinal: Negative.   Endocrine: Negative.   Genitourinary: Negative.    Musculoskeletal: Negative.   Skin: Negative.   Neurological: Negative.   Psychiatric/Behavioral: Negative.      Physical Exam:  Vital signs show temperature 98.8.  Pulse 54.  Blood pressure 111/66.  Weight is 206 pounds.    Wt Readings from Last 3 Encounters:  08/13/23 204 lb (92.5 kg)  06/18/23 205 lb (93 kg)  04/02/23 204 lb 6.4 oz (92.7 kg)    Physical Exam Vitals reviewed.  HENT:     Head: Normocephalic and atraumatic.  Eyes:     Pupils: Pupils are equal, round, and reactive to light.  Cardiovascular:     Rate and Rhythm: Normal rate and regular rhythm.     Heart sounds: Normal heart sounds.  Pulmonary:     Effort: Pulmonary effort is normal.     Breath sounds: Normal breath sounds.  Abdominal:     General: Bowel sounds are normal.     Palpations: Abdomen is soft.  Musculoskeletal:        General: No tenderness or deformity. Normal range of motion.     Cervical back: Normal range of motion.     Comments: Has a little bit of swelling with the left knee.  This is still postop.   Has a healing surgical scar on the left knee.  He has little bit of warmth and a slightly bit of erythema.  Lymphadenopathy:     Cervical: No cervical adenopathy.  Skin:    General: Skin is warm and dry.     Findings: No erythema or rash.  Neurological:     Mental Status: He is alert and oriented to person, place, and time.  Psychiatric:        Behavior: Behavior normal.        Thought Content: Thought content normal.        Judgment: Judgment normal.     Lab Results  Component Value Date   WBC 9.1 10/14/2023   HGB 12.2 (L) 10/14/2023   HCT 35.2 (L) 10/14/2023   MCV 87.3 10/14/2023   PLT 197 10/14/2023     Chemistry      Component Value Date/Time   NA 140 08/13/2023 0929   K 3.6 08/13/2023 0929   CL 103 08/13/2023 0929   CO2 29 08/13/2023 0929   BUN 30 (H) 08/13/2023 0929   CREATININE 1.19 08/13/2023 0929      Component Value Date/Time   CALCIUM 9.8 08/13/2023 0929   ALKPHOS 67 08/13/2023 0929   AST 13 (L) 08/13/2023 0929   ALT 14 08/13/2023 0929   BILITOT 0.5 08/13/2023 0929       Impression and Plan: Mr. Twisdale is a very nice 83 year old white male.  He served our country.  He was in the [employer]  He was in [place] with suspected agent orange exposure.   I am not sure at all as to what might be going on.  I think we have to get x-rays and scans.  Will get a plain x-ray of the right hip today.  I will see about getting CT scan in the next day or so.  His physical exam was pretty much unremarkable.  I do not have anything that looked suspicious.  There is no hepatomegaly.  He had decent bowel sounds.  There is no fluid wave.  Again, I would be shocked if this is anything related to the myeloma.  However, since we know he has MGUS, we have to follow-up with this.  We will continue have him keep his appointment as already scheduled with this.   Ivor Mars, MD 5/12/20251:31 PM

## 2023-10-15 LAB — KAPPA/LAMBDA LIGHT CHAINS
Kappa free light chain: 17 mg/L (ref 3.3–19.4)
Kappa, lambda light chain ratio: 0.07 — ABNORMAL LOW (ref 0.26–1.65)
Lambda free light chains: 230.5 mg/L — ABNORMAL HIGH (ref 5.7–26.3)

## 2023-10-15 LAB — IGG, IGA, IGM
IgA: 129 mg/dL (ref 61–437)
IgG (Immunoglobin G), Serum: 2191 mg/dL — ABNORMAL HIGH (ref 603–1613)
IgM (Immunoglobulin M), Srm: 39 mg/dL (ref 15–143)

## 2023-10-21 ENCOUNTER — Ambulatory Visit: Payer: Self-pay | Admitting: Hematology & Oncology

## 2023-10-21 LAB — PROTEIN ELECTROPHORESIS, SERUM, WITH REFLEX
A/G Ratio: 0.7 (ref 0.7–1.7)
Albumin ELP: 2.9 g/dL (ref 2.9–4.4)
Alpha-1-Globulin: 0.3 g/dL (ref 0.0–0.4)
Alpha-2-Globulin: 0.9 g/dL (ref 0.4–1.0)
Beta Globulin: 0.9 g/dL (ref 0.7–1.3)
Gamma Globulin: 1.9 g/dL — ABNORMAL HIGH (ref 0.4–1.8)
Globulin, Total: 3.9 g/dL (ref 2.2–3.9)
M-Spike, %: 1.2 g/dL — ABNORMAL HIGH
SPEP Interpretation: 0
Total Protein ELP: 6.8 g/dL (ref 6.0–8.5)

## 2023-10-21 LAB — IMMUNOFIXATION REFLEX, SERUM
IgA: 130 mg/dL (ref 61–437)
IgG (Immunoglobin G), Serum: 2170 mg/dL — ABNORMAL HIGH (ref 603–1613)
IgM (Immunoglobulin M), Srm: 38 mg/dL (ref 15–143)

## 2023-10-23 ENCOUNTER — Encounter: Payer: Self-pay | Admitting: *Deleted

## 2023-10-23 ENCOUNTER — Ambulatory Visit (HOSPITAL_BASED_OUTPATIENT_CLINIC_OR_DEPARTMENT_OTHER)
Admission: RE | Admit: 2023-10-23 | Discharge: 2023-10-23 | Disposition: A | Discharge status: Home / Self Care | Source: Ambulatory Visit | Attending: Hematology & Oncology | Admitting: Hematology & Oncology

## 2023-10-23 ENCOUNTER — Encounter (HOSPITAL_BASED_OUTPATIENT_CLINIC_OR_DEPARTMENT_OTHER): Payer: Self-pay

## 2023-10-23 DIAGNOSIS — R109 Unspecified abdominal pain: Secondary | ICD-10-CM | POA: Diagnosis not present

## 2023-10-23 DIAGNOSIS — R911 Solitary pulmonary nodule: Secondary | ICD-10-CM | POA: Diagnosis not present

## 2023-10-23 DIAGNOSIS — I700 Atherosclerosis of aorta: Secondary | ICD-10-CM | POA: Diagnosis not present

## 2023-10-23 DIAGNOSIS — D472 Monoclonal gammopathy: Secondary | ICD-10-CM | POA: Insufficient documentation

## 2023-10-23 MED ORDER — IOHEXOL 300 MG/ML  SOLN
100.0000 mL | Freq: Once | INTRAMUSCULAR | Status: AC | PRN
  Administered 2023-10-23: 100 mL via INTRAVENOUS

## 2023-11-05 ENCOUNTER — Encounter: Payer: Self-pay | Admitting: Hematology & Oncology

## 2023-11-05 ENCOUNTER — Inpatient Hospital Stay: Attending: Hematology & Oncology

## 2023-11-05 ENCOUNTER — Inpatient Hospital Stay (HOSPITAL_BASED_OUTPATIENT_CLINIC_OR_DEPARTMENT_OTHER): Admitting: Hematology & Oncology

## 2023-11-05 VITALS — BP 131/57 | HR 59 | Temp 98.4°F | Resp 18 | Ht 69.0 in | Wt 204.0 lb

## 2023-11-05 DIAGNOSIS — I2510 Atherosclerotic heart disease of native coronary artery without angina pectoris: Secondary | ICD-10-CM | POA: Insufficient documentation

## 2023-11-05 DIAGNOSIS — C9000 Multiple myeloma not having achieved remission: Secondary | ICD-10-CM | POA: Insufficient documentation

## 2023-11-05 DIAGNOSIS — M25551 Pain in right hip: Secondary | ICD-10-CM

## 2023-11-05 DIAGNOSIS — Z7901 Long term (current) use of anticoagulants: Secondary | ICD-10-CM | POA: Diagnosis not present

## 2023-11-05 DIAGNOSIS — D472 Monoclonal gammopathy: Secondary | ICD-10-CM

## 2023-11-05 LAB — CBC WITH DIFFERENTIAL (CANCER CENTER ONLY)
Abs Immature Granulocytes: 0.03 10*3/uL (ref 0.00–0.07)
Basophils Absolute: 0.1 10*3/uL (ref 0.0–0.1)
Basophils Relative: 1 %
Eosinophils Absolute: 0.1 10*3/uL (ref 0.0–0.5)
Eosinophils Relative: 1 %
HCT: 36.1 % — ABNORMAL LOW (ref 39.0–52.0)
Hemoglobin: 12.3 g/dL — ABNORMAL LOW (ref 13.0–17.0)
Immature Granulocytes: 0 %
Lymphocytes Relative: 30 %
Lymphs Abs: 2.2 10*3/uL (ref 0.7–4.0)
MCH: 29.9 pg (ref 26.0–34.0)
MCHC: 34.1 g/dL (ref 30.0–36.0)
MCV: 87.8 fL (ref 80.0–100.0)
Monocytes Absolute: 0.6 10*3/uL (ref 0.1–1.0)
Monocytes Relative: 9 %
Neutro Abs: 4.3 10*3/uL (ref 1.7–7.7)
Neutrophils Relative %: 59 %
Platelet Count: 277 10*3/uL (ref 150–400)
RBC: 4.11 MIL/uL — ABNORMAL LOW (ref 4.22–5.81)
RDW: 13.2 % (ref 11.5–15.5)
WBC Count: 7.3 10*3/uL (ref 4.0–10.5)
nRBC: 0 % (ref 0.0–0.2)

## 2023-11-05 LAB — CMP (CANCER CENTER ONLY)
ALT: 12 U/L (ref 0–44)
AST: 12 U/L — ABNORMAL LOW (ref 15–41)
Albumin: 3.9 g/dL (ref 3.5–5.0)
Alkaline Phosphatase: 64 U/L (ref 38–126)
Anion gap: 11 (ref 5–15)
BUN: 27 mg/dL — ABNORMAL HIGH (ref 8–23)
CO2: 26 mmol/L (ref 22–32)
Calcium: 9.9 mg/dL (ref 8.9–10.3)
Chloride: 101 mmol/L (ref 98–111)
Creatinine: 1.2 mg/dL (ref 0.61–1.24)
GFR, Estimated: >60 mL/min (ref >60)
Glucose, Bld: 161 mg/dL — ABNORMAL HIGH (ref 70–99)
Potassium: 3.7 mmol/L (ref 3.5–5.1)
Sodium: 138 mmol/L (ref 135–145)
Total Bilirubin: 0.5 mg/dL (ref 0.0–1.2)
Total Protein: 7.9 g/dL (ref 6.5–8.1)

## 2023-11-05 LAB — LACTATE DEHYDROGENASE: LDH: 123 U/L (ref 98–192)

## 2023-11-05 NOTE — Progress Notes (Signed)
 Hematology and Oncology Follow Up Visit  Cameron Webb [id number] 12/18/1940 83 y.o. 11/05/2023   Principle Diagnosis:  IgG Lambda  myeloma -- nl cytogenetics/FISH  Current Therapy:   Observation     Interim History:  Cameron Webb is back for follow-up.  He is still having problems with his abdominal pain.  It seems like this only seems to happen at nighttime when he is lying down.  It is more so on the right side.  We did do a CT of his abdomen and pelvis.  This was done on 10/23/2023.  This really did not show anything that looked significant.  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  As such, I just wonder if he may not have some issues with mesenteric ischemia.  Of note, he is on Eliquis.  He has had no vomiting.  Has had no diarrhea.  No problems with bleeding.  He has had no cough or shortness of breath.  We did do some hip x-rays on him.  This only shows some arthritis.  I do not think anything is related to him having myeloma.When we last saw him, his monoclonal spike was 1.2 g/dL.  His IgG level was 2180 mg/dL.  The lambda light chain was 23.1 mg/dL.  All this is holding steady.  He has had no fever.  Has had no bleeding.  Has had no leg swelling.  Overall, I would say his performance status is probably ECOG 1.       Wt Readings from Last 3 Encounters:  10/14/23 206 lb (93.4 kg)  08/13/23 204 lb (92.5 kg)  06/18/23 205 lb (93 kg)     Medications:  Current Outpatient Medications:    folic acid (FOLVITE) 1 MG tablet, Take 1 tablet by mouth daily., Disp: , Rfl:    apixaban (ELIQUIS) 5 MG TABS tablet, 5 mg 2 (two) times daily., Disp: , Rfl:    chlorthalidone (HYGROTON) 25 MG tablet, Take 25 mg by mouth daily., Disp: , Rfl:    Cholecalciferol 125 MCG (5000 UT) capsule, Take by mouth 2 (two) times daily., Disp: , Rfl:    cyanocobalamin (VITAMIN B12) 500 MCG tablet, Take 500 mcg by mouth daily., Disp: , Rfl:    diltiazem (CARDIZEM  CD) 180 MG 24 hr capsule, Take 180 mg by mouth daily., Disp: , Rfl:    Folic Acid-Cholecalciferol 06-3773 MG-UNIT TABS, Take 1 tablet by mouth daily., Disp: , Rfl:    ketoconazole (NIZORAL) 2 % shampoo, Apply 1 Application topically as needed., Disp: , Rfl:    losartan (COZAAR) 100 MG tablet, Take 100 mg by mouth daily., Disp: , Rfl:    lovastatin (MEVACOR) 40 MG tablet, Take 40 mg by mouth at bedtime., Disp: , Rfl:    metFORMIN (GLUCOPHAGE-XR) 500 MG 24 hr tablet, Take by mouth daily with breakfast., Disp: , Rfl:    sotalol (BETAPACE) 80 MG tablet, Take 80 mg by mouth 2 (two) times daily., Disp: , Rfl:   Allergies: No Known Allergies  Past Medical History, Surgical history, Social history, and Family History were reviewed and updated.  Review of Systems: Review of Systems  Constitutional: Negative.   HENT:  Negative.    Eyes: Negative.   Respiratory: Negative.    Cardiovascular: Negative.   Gastrointestinal: Negative.   Endocrine: Negative.   Genitourinary: Negative.    Musculoskeletal: Negative.   Skin: Negative.   Neurological: Negative.   Psychiatric/Behavioral: Negative.      Physical Exam:  Vital signs show temperature 98.4.  Pulse 59.  Blood pressure 131/57.  Weight is 204 pounds.  Wt Readings from Last 3 Encounters:  10/14/23 206 lb (93.4 kg)  08/13/23 204 lb (92.5 kg)  06/18/23 205 lb (93 kg)    Physical Exam Vitals reviewed.  HENT:     Head: Normocephalic and atraumatic.  Eyes:     Pupils: Pupils are equal, round, and reactive to light.  Cardiovascular:     Rate and Rhythm: Normal rate and regular rhythm.     Heart sounds: Normal heart sounds.  Pulmonary:     Effort: Pulmonary effort is normal.     Breath sounds: Normal breath sounds.  Abdominal:     General: Bowel sounds are normal.     Palpations: Abdomen is soft.  Musculoskeletal:        General: No tenderness or deformity. Normal range of motion.     Cervical back: Normal range of motion.      Comments: Has a little bit of swelling with the left knee.  This is still postop.  Has a healing surgical scar on the left knee.  He has little bit of warmth and a slightly bit of erythema.  Lymphadenopathy:     Cervical: No cervical adenopathy.  Skin:    General: Skin is warm and dry.     Findings: No erythema or rash.  Neurological:     Mental Status: He is alert and oriented to person, place, and time.  Psychiatric:        Behavior: Behavior normal.        Thought Content: Thought content normal.        Judgment: Judgment normal.      Lab Results  Component Value Date   WBC 9.1 10/14/2023   HGB 12.2 (L) 10/14/2023   HCT 35.2 (L) 10/14/2023   MCV 87.3 10/14/2023   PLT 197 10/14/2023     Chemistry      Component Value Date/Time   NA 139 10/14/2023 1312   K 3.4 (L) 10/14/2023 1312   CL 101 10/14/2023 1312   CO2 29 10/14/2023 1312   BUN 24 (H) 10/14/2023 1312   CREATININE 1.11 10/14/2023 1312      Component Value Date/Time   CALCIUM 9.9 10/14/2023 1312   ALKPHOS 64 10/14/2023 1312   AST 11 (L) 10/14/2023 1312   ALT 19 10/14/2023 1312   BILITOT 0.5 10/14/2023 1312       Impression and Plan: Cameron Webb is a very nice 82 year old white male.  He served our country.  He was in the [employer]  He was in [place] with suspected Agent Orange exposure.   Again, I am not sure why he has his abdominal discomfort.  I do not know why it is only at nighttime.  He does see his family doctor I think in July.  I do not know if he needs to see any kind of gastroenterologist.  He said he had a colonoscopy 2 years ago at the [facility].  Again, I do not see anything that would be related to myeloma.  There is no obvious bony lesions.  We will continue to follow along.  I would like to plan to get him back to see me probably in a couple months.   Cameron Mars, MD 6/3/20258:20 AM

## 2023-11-06 LAB — KAPPA/LAMBDA LIGHT CHAINS
Kappa free light chain: 16.9 mg/L (ref 3.3–19.4)
Kappa, lambda light chain ratio: 0.07 — ABNORMAL LOW (ref 0.26–1.65)
Lambda free light chains: 247.8 mg/L — ABNORMAL HIGH (ref 5.7–26.3)

## 2023-11-06 LAB — IGG, IGA, IGM
IgA: 125 mg/dL (ref 61–437)
IgG (Immunoglobin G), Serum: 2276 mg/dL — ABNORMAL HIGH (ref 603–1613)
IgM (Immunoglobulin M), Srm: 37 mg/dL (ref 15–143)

## 2023-11-08 ENCOUNTER — Inpatient Hospital Stay

## 2023-11-08 DIAGNOSIS — C9000 Multiple myeloma not having achieved remission: Secondary | ICD-10-CM | POA: Diagnosis not present

## 2023-11-08 DIAGNOSIS — M25551 Pain in right hip: Secondary | ICD-10-CM

## 2023-11-08 DIAGNOSIS — D472 Monoclonal gammopathy: Secondary | ICD-10-CM

## 2023-11-08 LAB — IMMUNOFIXATION REFLEX, SERUM
IgA: 126 mg/dL (ref 61–437)
IgG (Immunoglobin G), Serum: 2289 mg/dL — ABNORMAL HIGH (ref 603–1613)
IgM (Immunoglobulin M), Srm: 44 mg/dL (ref 15–143)

## 2023-11-08 LAB — PROTEIN ELECTROPHORESIS, SERUM, WITH REFLEX
A/G Ratio: 0.9 (ref 0.7–1.7)
Albumin ELP: 3.4 g/dL (ref 2.9–4.4)
Alpha-1-Globulin: 0.2 g/dL (ref 0.0–0.4)
Alpha-2-Globulin: 0.9 g/dL (ref 0.4–1.0)
Beta Globulin: 0.9 g/dL (ref 0.7–1.3)
Gamma Globulin: 2 g/dL — ABNORMAL HIGH (ref 0.4–1.8)
Globulin, Total: 4 g/dL — ABNORMAL HIGH (ref 2.2–3.9)
M-Spike, %: 1.5 g/dL — ABNORMAL HIGH
SPEP Interpretation: 0
Total Protein ELP: 7.4 g/dL (ref 6.0–8.5)

## 2023-11-13 ENCOUNTER — Encounter: Payer: Self-pay | Admitting: *Deleted

## 2023-11-13 LAB — UPEP/UIFE/LIGHT CHAINS/TP, 24-HR UR
% BETA, Urine: 15.8 %
ALPHA 1 URINE: 3.8 %
Albumin, U: 50.9 %
Alpha 2, Urine: 6.5 %
Free Kappa Lt Chains,Ur: 4.84 mg/L (ref 1.17–86.46)
Free Kappa/Lambda Ratio: 0.38 — ABNORMAL LOW (ref 1.83–14.26)
Free Lambda Lt Chains,Ur: 12.71 mg/L (ref 0.27–15.21)
GAMMA GLOBULIN URINE: 23 %
M-SPIKE %, Urine: 12.1 % — ABNORMAL HIGH
M-Spike, Mg/24 Hr: 17 mg/(24.h) — ABNORMAL HIGH
Total Protein, Urine-Ur/day: 143 mg/(24.h) (ref 30–150)
Total Protein, Urine: 10.2 mg/dL
Total Volume: 1400

## 2023-12-16 NOTE — Progress Notes (Signed)
 Patient name: Cameron Webb  Date of birth: 03/02/1941 Date of visit: 12/16/2023 Referring Provider: No ref. provider found   Primary Care Provider: Lacinda Rosaline Gaskins, MD                                                Chief Complaint:  Chief Complaint  Patient presents with  . Follow-up    Cardiac clearance    Impression /Plan:    Assessment & Plan 1. Paroxysmal Atrial Fibrillation: - Currently in sinus rhythm, reducing stroke risk. - Blood pressure is well-controlled. - Hold Eliquis for 48 hours prior to urology procedure and resume on Thursday night. - Generic letter for cardiac clearance will be sent to Dr. Marykay.  2. Hyperlipidemia: - Continue current medication regimen.  3. Hypertension: - Blood pressure is well-controlled. - Continue current medication regimen.  4. Type 2 Diabetes Mellitus: - Blood sugar levels are borderline. - Started eating more salads and walking. - Continue monitoring blood sugar levels, especially for healing purposes post-surgery. - Regular doctor will continue to manage diabetes.  5. Kidney Function Monitoring: - Experiences pain when lying on back and yawning, possibly related to kidneys. - Discuss with Dr. Marykay and new primary care physician to determine if further kidney function tests are needed.  Follow-up: Scheduled in 6 months.     History Present Illness:   History of Present Illness The patient is an 83 year old male here for follow-up of paroxysmal atrial fibrillation, hyperlipidemia, hypertension, and type 2 diabetes mellitus. He needs cardiac clearance.  Approximately a year ago, he underwent a right hip implant procedure to manage urinary leakage, a complication from prostate cancer treatment 23 years prior. The implant was deactivated in January during his knee replacement surgery and has not been reactivated since. He experiences discomfort when sitting for extended periods or lying on his back. He  consulted with Dr. Marykay last week, who scheduled the reactivation procedure for this week due to his upcoming absence and subsequent full schedule. He was instructed to discontinue Eliquis two days prior to the procedure and resume it on Thursday night. He has also stopped taking vitamin D, B12, and stool softeners as directed.  He reports no abdominal pain but does experience discomfort when lying in bed at night. He has discussed this with Dr. Marykay and his oncologist, who ruled out the possibility of a tumor or cancer following a clear CT scan. He is curious if his symptoms could be related to his kidneys.  His blood sugar levels were checked at the [facility] about 1.5 months ago and were found to be borderline. His doctor plans to monitor this closely and take further action if the levels exceed 7. In recent weeks, he has increased his salad intake and started walking regularly.  He is getting a new doctor starting Wednesday.  PAST SURGICAL HISTORY: Right hip implant (approximately one year ago) Knee replacement (06/2023) Prostate cancer treatment (23 years ago)  SOCIAL HISTORY Exercise: The patient has started walking. Diet: The patient has started eating a lot of salads.  FAMILY HISTORY - Father: One kidney reduced to the size of an egg, other kidney enlarged to eight times its size.     Past Medical History:  Medical History[1]   Social History:   Social History   Socioeconomic History  . Marital status: Married  Spouse name: Not on file  . Number of children: Not on file  . Years of education: Not on file  . Highest education level: Not on file  Occupational History  . Not on file  Tobacco Use  . Smoking status: Former    Current packs/day: 0.00    Types: Cigarettes    Quit date: 06/04/1965    Years since quitting: 58.5  . Smokeless tobacco: Never  Substance and Sexual Activity  . Alcohol use: No  . Drug use: No  . Sexual activity: Not on file  Other Topics  Concern  . Not on file  Social History Narrative  . Not on file   Social Drivers of Health   Food Insecurity: No Food Insecurity (06/07/2023)   Received from [facility]   Food vital sign   . Within the past 12 months, you worried that your food would run out before you got money to buy more: Never true   . Within the past 12 months, the food you bought just didn't last and you didn't have money to get more: Never true  Transportation Needs: No Transportation Needs (06/07/2023)   [facility] - [facility]   . Lack of Transportation (Medical): No   . Lack of Transportation (Non-Medical): No  Safety: Not At Risk (07/02/2023)   Received from [facility]   HITS   . Over the last 12 months how often did your partner physically hurt you?: Never   . Over the last 12 months how often did your partner insult you or talk down to you?: Never   . Over the last 12 months how often did your partner threaten you with physical harm?: Never   . Over the last 12 months how often did your partner scream or curse at you?: Never  Living Situation: Low Risk  (06/07/2023)   Received from [facility] Situation   . In the last 12 months, was there a time when you were not able to pay the mortgage or rent on time?: No   . In the past 12 months, how many times have you moved where you were living?: 1   . At any time in the past 12 months, were you homeless or living in a shelter (including now)?: No    Family History :  Family History[2]   Current Medications: Current Medications[3]  Allergies:   Allergies[4]   Review Of  Systems:  Negative except back pain History obtained from chart review and the patient General ROS: negative Hematological and Lymphatic ROS: negative Endocrine ROS: negative Respiratory ROS: no cough, shortness of breath, or wheezing Cardiovascular ROS: no chest pain or dyspnea on exertion Gastrointestinal ROS: no abdominal pain, change  in bowel habits, or black or bloody stools Musculoskeletal ROS: negative  Vital Signs:  BP 110/65   Pulse 62   Wt 93.9 kg (207 lb)   SpO2 97%   BMI 30.13 kg/m  Wt Readings from Last 3 Encounters:  12/16/23 93.9 kg (207 lb)  05/08/23 91.6 kg (202 lb)  02/19/23 94.7 kg (208 lb 12.8 oz)    Physical Examination Constitutional: See Vital Signs                          No Acute Distress Normal Body Habitus and development  Eyes:  Normal conjunctivae Ears Nose and Throat: Normal  Neck: No significant jugular venous distension.  Thyroid does  not appear enlarged visually Respiratory:   No increased work of breathing; CTBA; No wheezes rhonchi or rales  Cardiovascular Exam:   No palpable thrills or lifts, PMI non displaced.  S1 S2   regular rate and  rhythm   No murmurs;  No rubs or gallop.   Normal Carotid upstroke, No Carotid Bruits  No abnormal abdominal pulsation or bruits  Pedal/radial Pulses - present  No significant edema or varicosities   Abdomen  Soft non tender;  non distended, + bowel sounds, No obvious HSM or masses Ext:  No clubbing or cyanosis  MSK:  Generally normal strength;   Neuro:  Alert and Oriented x 2  Grossly normal non focal, Normal mood and affect Psych:  Normal Mood and affect Skin: No rash   EKG: Sinus rhythm with a left bundle branch block at 61 bpm   Diagnostic Studies:  Lab Results  Component Value Date   WBC 9.9 07/22/2022   HGB 13.5 (L) 07/22/2022   HCT 39.3 (L) 07/22/2022   PLT 242 07/22/2022    Lab Results  Component Value Date   NA 134 (L) 07/22/2022   K 4.4 07/22/2022   CL 105 07/22/2022   CO2 25 07/22/2022   BUN 34 (H) 07/22/2022   CREATININE 1.26 07/22/2022   GLUCOSE 117 (H) 07/22/2022   CALCIUM 8.3 (L) 07/22/2022   MG 2.2 07/22/2022    Lab Results  Component Value Date   BILITOT 0.6 07/22/2022   PROT 5.7 (L) 07/22/2022   ALBUMIN 2.8 (L) 07/22/2022   ALT 17 07/22/2022   AST 10 07/22/2022   ALP 46 07/22/2022    Lab  Results  Component Value Date   INR 1.48 07/11/2022    Lab Results  Component Value Date   CHOL 115 07/11/2021   CHOL 114 06/29/2020   Lab Results  Component Value Date   HDL 31 (L) 07/11/2021   HDL 30 (L) 06/29/2020   Lab Results  Component Value Date   LDLDIRECT 70 07/11/2021   LDLDIRECT 60 06/29/2020   Lab Results  Component Value Date   TRIG 152 (H) 07/11/2021   TRIG 227 (H) 06/29/2020   Lab Results  Component Value Date   HDL 31 (L) 07/11/2021   HDL 30 (L) 06/29/2020      Thank you for allowing me to participate in the care of your patient.  Please feel free to contact me ifyou need any further information.  Samandra Nadine Tann, NP          [1] Past Medical History: Diagnosis Date  . A-fib    (CMD)   . Brachial neuritis   . Cancer    (CMD)    prostate  . Cervical spondylosis without myelopathy   . Diverticulitis   . Exposure to Agent Orange    in [place]  . History of colon polyps   . History of left bundle branch block (LBBB)   . Hypertension    controlled with medications  . Influenza with respiratory manifestation   . Other disorders of lung (CODE)   . Primary localized osteoarthrosis of shoulder   . Psoriatic arthritis    (CMD)    sees Dr. Selma  . Sciatica   . Sleep apnea, obstructive   . Synovitis and tenosynovitis   . Trigger index finger    bilateral  . Type 2 diabetes mellitus without complication, without long-term current use of insulin    (CMD) 01/02/2021  [2] Family History Problem Relation  Name Age of Onset  . Alzheimer's disease Mother    . Heart failure Mother    . Heart disease Mother    . Diabetes Father    . Heart disease Father    . Kidney disease Father    . Colon cancer Sister    . Cancer Sister    . Clotting disorder Neg Hx    . Allergic rhinitis Neg Hx    . Hearing loss Neg Hx    . Hypertension Neg Hx    . Osteoporosis Neg Hx    . Migraines Neg Hx    . Stroke Neg Hx    [3] Current Outpatient Medications   Medication Sig Dispense Refill  . apixaban (Eliquis) 5 mg tab Take 5 mg by mouth 2 (two) times a day. Indications: treatment to prevent blood clots in chronic atrial fibrillation 60 tablet 11  . chlorthalidone (HYGROTON) 25 mg tablet Take 12.5 mg by mouth.    . cholecalciferol (VITAMIN D3) 125 mcg (5,000 unit) capsule Take 5,000 Units by mouth Once Daily.    . cyanocobalamin (VITAMIN B12) 1,000 mcg tablet Take 2,000 mcg by mouth Once Daily.    . dilTIAZem (CARDIZEM CD) 180 mg 24 hr capsule Take 1 capsule (180 mg total) by mouth daily Indications: high blood pressure. 31 capsule 0  . folic acid (FOLVITE) 1 mg tablet Take 1 mg by mouth Once Daily. 30 tablet 3  . losartan (COZAAR) 100 mg tablet Take 50 mg by mouth daily. Patient is taking 0.5 daily    . lovastatin (MEVACOR) 40 mg tablet TAKE 1 TABLET BY MOUTH EVERY NIGHT AT BEDTIME 30 tablet 5  . metFORMIN (GLUCOPHAGE-XR) 500 mg 24 hr tablet Take 500 mg by mouth daily with breakfast.    . methotrexate 2.5 mg tablet Indications: psoriasis associated with arthritis 30 tablet 3  . solifenacin (VESICARE) 10 mg tablet Take 10 mg by mouth daily.    . sotaloL (BETAPACE) 80 mg tablet Take 1 tablet (80 mg total) by mouth 2 (two) times a day Indications: prevention of recurrent atrial fibrillation. Take 1 tablet by mouth twice a day: once in the morning and once in the evening. 62 tablet 0   No current facility-administered medications for this visit.  [4] No Known Allergies

## 2023-12-18 ENCOUNTER — Encounter: Payer: Self-pay | Admitting: Medical

## 2023-12-18 ENCOUNTER — Ambulatory Visit (INDEPENDENT_AMBULATORY_CARE_PROVIDER_SITE_OTHER): Admitting: Medical

## 2023-12-18 VITALS — BP 120/68 | HR 54 | Temp 97.9°F | Resp 17 | Ht 69.0 in | Wt 205.4 lb

## 2023-12-18 DIAGNOSIS — I480 Paroxysmal atrial fibrillation: Secondary | ICD-10-CM

## 2023-12-18 DIAGNOSIS — E785 Hyperlipidemia, unspecified: Secondary | ICD-10-CM | POA: Diagnosis not present

## 2023-12-18 DIAGNOSIS — R739 Hyperglycemia, unspecified: Secondary | ICD-10-CM

## 2023-12-18 DIAGNOSIS — I10 Essential (primary) hypertension: Secondary | ICD-10-CM

## 2023-12-18 LAB — COMPREHENSIVE METABOLIC PANEL WITH GFR
ALT: 11 U/L (ref 0–53)
AST: 12 U/L (ref 0–37)
Albumin: 3.9 g/dL (ref 3.5–5.2)
Alkaline Phosphatase: 63 U/L (ref 39–117)
BUN: 28 mg/dL — ABNORMAL HIGH (ref 6–23)
CO2: 29 meq/L (ref 19–32)
Calcium: 10 mg/dL (ref 8.4–10.5)
Chloride: 103 meq/L (ref 96–112)
Creatinine, Ser: 1.25 mg/dL (ref 0.40–1.50)
GFR: 53.8 mL/min — ABNORMAL LOW (ref >60.00)
Glucose, Bld: 102 mg/dL — ABNORMAL HIGH (ref 70–99)
Potassium: 3.8 meq/L (ref 3.5–5.1)
Sodium: 139 meq/L (ref 135–145)
Total Bilirubin: 0.6 mg/dL (ref 0.2–1.2)
Total Protein: 7.7 g/dL (ref 6.0–8.3)

## 2023-12-18 LAB — HEMOGLOBIN A1C: Hgb A1c MFr Bld: 6.3 % (ref 4.6–6.5)

## 2023-12-18 NOTE — Progress Notes (Signed)
 Subjective:    Patient ID: Cameron Webb, male    DOB: 08/10/1940, 83 y.o.   MRN: [id number]  HPI  Pt in for first time.  He has a history of atrial fibrillation and is currently on Eliquis and sotalol for rate control.  He is on losartan 100 mg daily and chlorthalidone 25 mg daily, for htn.  Lovastatin 40 mg daily for high cholesterol. Blood pressure readings over the past month have ranged from 113/77 to 140/77, with most readings below 130/80.  He has a history of prostate cancer treated 23 years ago, resulting in urinary leakage. A device was implanted in his right hip to control leakage, but it has been causing hip pain. The device was turned off prior to his knee surgery in January and has not been turned back on. He currently uses one pad and one pull-up per day.  He underwent bilateral knee replacements, with the right knee replaced six years ago and the left knee in January. He has started walking 30-40 minutes daily without knee pain.  He is a borderline diabetic, with an A1c previously noted to be at the border of diabetes. He has been making dietary changes, cutting out sweets, and has lost six pounds recently.  He is followed by a hematologist/oncologist for monoclonal gammopathy of unknown significance and has an upcoming appointment in early August.         Review of Systems  Constitutional:  Negative for chills, fatigue and fever.  HENT:  Negative for congestion, ear pain, facial swelling and hearing loss.   Respiratory:  Negative for chest tightness, shortness of breath and wheezing.   Cardiovascular:  Negative for chest pain and palpitations.  Gastrointestinal:  Negative for abdominal pain, blood in stool, diarrhea, nausea and vomiting.  Genitourinary:  Negative for dysuria and frequency.  Musculoskeletal:  Negative for back pain.  Skin:  Negative for rash.  Neurological:  Negative for dizziness, speech difficulty, weakness and light-headedness.   Hematological:  Negative for adenopathy. Does not bruise/bleed easily.  Psychiatric/Behavioral:  Negative for behavioral problems and dysphoric mood. The patient is not nervous/anxious.      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     Social History   Socioeconomic History   Marital status: Married    Spouse name: Not on file   Number of children: Not on file   Years of education: Not on file   Highest education level: Not on file  Occupational History   Not on file  Tobacco Use   Smoking status: Never   Smokeless tobacco: Never  Vaping Use   Vaping status: Never Used  Substance and Sexual Activity   Alcohol use: Not Currently   Drug use: Not Currently   Sexual activity: Not Currently  Other Topics Concern   Not on file  Social History Narrative   Not on file   Social Drivers of Health   Financial Resource Strain: Low Risk  (06/07/2023)   [facility]   Overall Financial Resource Strain (CARDIA)    Difficulty of Paying Living Expenses: Not hard at all  Food Insecurity: No Food Insecurity (06/07/2023)   Received from [facility]   Hunger Vital Sign    Within the past 12 months, you worried that your food would run out before you got the money to buy more.: Never true  Within the past 12 months, the food you bought just didn't last and you didn't have money to get more.: Never true  Transportation Needs: No Transportation Needs (06/07/2023)   Received from [facility]   PRAPARE - Transportation    Lack of Transportation (Medical): No    Lack of Transportation (Non-Medical): No  Physical Activity: Insufficiently Active (09/21/2022)   Exercise Vital Sign    Days of Exercise per Week: 1 day    Minutes of Exercise per Session: 30 min  Stress: No Stress Concern Present (09/21/2022)   [employer] of Occupational Health -  Occupational Stress Questionnaire    Feeling of Stress : Not at all  Social Connections: Socially Integrated (09/21/2022)   Social Connection and Isolation Panel    Frequency of Communication with Friends and Family: More than three times a week    Frequency of Social Gatherings with Friends and Family: Three times a week    Attends Religious Services: More than 4 times per year    Active Member of Clubs or Organizations: Yes    Attends Banker Meetings: More than 4 times per year    Marital Status: Married  Catering manager Violence: Not At Risk (07/02/2023)   Received from [facility]   HITS    Over the last 12 months how often did your partner physically hurt you?: Never    Over the last 12 months how often did your partner insult you or talk down to you?: Never    Over the last 12 months how often did your partner threaten you with physical harm?: Never    Over the last 12 months how often did your partner scream or curse at you?: Never    Past Surgical History:  Procedure Laterality Date   APPENDECTOMY     CIRCUMCISION     HERNIA REPAIR     PROSTATECTOMY     REPLACEMENT TOTAL KNEE Right    SHOULDER ARTHROSCOPY Bilateral    VASCULAR SURGERY      No family history on file.  No Known Allergies  Current Outpatient Medications on File Prior to Visit  Medication Sig Dispense Refill   apixaban (ELIQUIS) 5 MG TABS tablet 5 mg 2 (two) times daily.     chlorthalidone (HYGROTON) 25 MG tablet Take 25 mg by mouth daily.     Cholecalciferol 125 MCG (5000 UT) capsule Take by mouth 2 (two) times daily.     cyanocobalamin (VITAMIN B12) 500 MCG tablet Take 500 mcg by mouth daily.     diltiazem (CARDIZEM CD) 180 MG 24 hr capsule Take 180 mg by mouth daily.     folic acid (FOLVITE) 1 MG tablet Take 1 tablet by mouth daily.     Folic Acid-Cholecalciferol 06-3773 MG-UNIT TABS Take 1 tablet by mouth daily.     losartan (COZAAR) 100 MG tablet Take 100 mg by mouth daily.      lovastatin (MEVACOR) 40 MG tablet Take 40 mg by mouth at bedtime.     metFORMIN (GLUCOPHAGE-XR) 500 MG 24 hr tablet Take by mouth daily with breakfast.     sotalol (BETAPACE) 80 MG tablet Take 80 mg by mouth 2 (two) times daily.     ketoconazole (NIZORAL) 2 % shampoo Apply 1 Application topically as needed. (Patient not taking: Reported on 12/18/2023)     No current facility-administered medications on file prior to visit.    BP 120/68   Pulse (!) 54   Temp 97.9 F (36.6 C) (  Oral)   Resp 17   Ht 5' 9 (1.753 m)   Wt 205 lb 6.4 oz (93.2 kg)   SpO2 97%   BMI 30.33 kg/m          Objective:   Physical Exam  General Mental Status- Alert. General Appearance- Not in acute distress.   Skin General: Color- Normal Color. Moisture- Normal Moisture.  Neck No JVD.  Chest and Lung Exam Auscultation: Breath Sounds:-CTA  Cardiovascular Auscultation:Rythm- RRR Murmurs & Other Heart Sounds:Auscultation of the heart reveals- No Murmurs.  Abdomen Inspection:-Inspeection Normal. Palpation/Percussion:Note:No mass. Palpation and Percussion of the abdomen reveal- Non Tender, Non Distended + BS, no rebound or guarding.   Neurologic Cranial Nerve exam:- CN III-XII intact(No nystagmus), symmetric smile. Strength:- 5/5 equal and symmetric strength both upper and lower extremities.    Lower ext- no pedal edema. Negative homans sign. Calfs symmerri     Assessment & Plan:   Patient Instructions  1. PAF (paroxysmal atrial fibrillation) (HCC) (Primary) -continue eliquis and sotalol. -cleared for urologic procedure  2. Hypertension, unspecified type -bp controlled on chlorthalidone and losartan  3. Hyperlipidemia, unspecified hyperlipidemia type -lovastatin. Low cholesterol diet.  4. Elevated blood sugar  -A1c and cmp today  [facility] and one [facility] with me or sooner if needed  Full summary post visit(summary app delay) Urinary Incontinence Post-Prostate Cancer  Treatment Device removal scheduled due to discomfort and lack of subcutaneous fat at implant site. Cardiologist cleared for procedure. - Proceed with device removal by urologist Dr. Marykay.  Paroxysmal Atrial Fibrillation Current normal sinus rhythm. Heart rate well-controlled. Cardiologist cleared for urological procedure. - Continue Eliquis for anticoagulation. - Continue sotalol for rate control. - Follow up with cardiologist in six months.  Hypertension Well-controlled with losartan and chlorthalidone. - Continue losartan 100 mg daily. - Continue chlorthalidone 25 mg daily.  Hyperlipidemia Managed with lovastatin. No recent cholesterol levels available. - Continue lovastatin 40 mg daily. - Obtain recent cholesterol levels from the [facility].  Type 2 Diabetes Mellitus (Borderline) Borderline A1c. Lifestyle changes ongoing. Slight elevations in A1c less concerning at age 79. - Encourage continuation of low sugar diet and regular exercise.   Monoclonal Gammopathy of Undetermined Significance (MGUS) Monitored by hematologist/oncologist Dr. Sandre. - Follow up with Dr. Sandre on August 2nd or 3rd.  General Health Maintenance Engaging in lifestyle modifications for overall health improvement. - Encourage continuation of walking and healthy diet. - [facility] and obtain [facility] of lab results for comprehensive management.  Follow-up Coordination of care between [facility] and primary care for chronic conditions. - Schedule follow-up visits with primary care every six months, alternating with [facility] visits. - Obtain lab results from [facility] visits and provide copies to primary care.   Kasir Hallenbeck, PA-C

## 2023-12-18 NOTE — Patient Instructions (Addendum)
 1. PAF (paroxysmal atrial fibrillation) (HCC) (Primary) -continue eliquis and sotalol. -cleared for urologic procedure  2. Hypertension, unspecified type -bp controlled on chlorthalidone and losartan  3. Hyperlipidemia, unspecified hyperlipidemia type -lovastatin. Low cholesterol diet.  4. Elevated blood sugar  -A1c and cmp today  [facility] and one year with me or sooner if needed  Full summary post visit(summary app delay) Urinary Incontinence Post-Prostate Cancer Treatment Device removal scheduled due to discomfort and lack of subcutaneous fat at implant site. Cardiologist cleared for procedure. - Proceed with device removal by urologist Dr. Marykay.  Paroxysmal Atrial Fibrillation Current normal sinus rhythm. Heart rate well-controlled. Cardiologist cleared for urological procedure. - Continue Eliquis for anticoagulation. - Continue sotalol for rate control. - Follow up with cardiologist in six months.  Hypertension Well-controlled with losartan and chlorthalidone. - Continue losartan 100 mg daily. - Continue chlorthalidone 25 mg daily.  Hyperlipidemia Managed with lovastatin. No recent cholesterol levels available. - Continue lovastatin 40 mg daily. - Obtain recent cholesterol levels from the [facility].  Type 2 Diabetes Mellitus (Borderline) Borderline A1c. Lifestyle changes ongoing. Slight elevations in A1c less concerning at age 83. - Encourage continuation of low sugar diet and regular exercise.   Monoclonal Gammopathy of Undetermined Significance (MGUS) Monitored by hematologist/oncologist Dr. Sandre. - Follow up with Dr. Sandre on August 2nd or 3rd.  General Health Maintenance Engaging in lifestyle modifications for overall health improvement. - Encourage continuation of walking and healthy diet. - [facility] and obtain [facility] of lab results for comprehensive management.  Follow-up Coordination of care between [facility] and primary care  for chronic conditions. - Schedule follow-up visits with primary care every six months, alternating with [facility] visits. - Obtain lab results from [facility] visits and provide copies to primary care.

## 2023-12-19 ENCOUNTER — Ambulatory Visit: Payer: Self-pay | Admitting: Medical

## 2024-01-01 ENCOUNTER — Ambulatory Visit: Admitting: Medical

## 2024-01-07 ENCOUNTER — Inpatient Hospital Stay: Attending: Hematology & Oncology

## 2024-01-07 ENCOUNTER — Other Ambulatory Visit: Payer: Self-pay

## 2024-01-07 ENCOUNTER — Inpatient Hospital Stay: Admitting: Hematology & Oncology

## 2024-01-07 VITALS — BP 120/61 | HR 61 | Temp 98.2°F | Resp 17 | Ht 69.0 in | Wt 205.0 lb

## 2024-01-07 DIAGNOSIS — Z77098 Contact with and (suspected) exposure to other hazardous, chiefly nonmedicinal, chemicals: Secondary | ICD-10-CM | POA: Insufficient documentation

## 2024-01-07 DIAGNOSIS — C9000 Multiple myeloma not having achieved remission: Secondary | ICD-10-CM | POA: Insufficient documentation

## 2024-01-07 DIAGNOSIS — D472 Monoclonal gammopathy: Secondary | ICD-10-CM | POA: Diagnosis not present

## 2024-01-07 LAB — CMP (CANCER CENTER ONLY)
ALT: 17 U/L (ref 0–44)
AST: 19 U/L (ref 15–41)
Albumin: 3.9 g/dL (ref 3.5–5.0)
Alkaline Phosphatase: 69 U/L (ref 38–126)
Anion gap: 13 (ref 5–15)
BUN: 22 mg/dL (ref 8–23)
CO2: 24 mmol/L (ref 22–32)
Calcium: 10.1 mg/dL (ref 8.9–10.3)
Chloride: 103 mmol/L (ref 98–111)
Creatinine: 1.25 mg/dL — ABNORMAL HIGH (ref 0.61–1.24)
GFR, Estimated: 57 mL/min — ABNORMAL LOW (ref >60)
Glucose, Bld: 138 mg/dL — ABNORMAL HIGH (ref 70–99)
Potassium: 3.8 mmol/L (ref 3.5–5.1)
Sodium: 140 mmol/L (ref 135–145)
Total Bilirubin: 0.6 mg/dL (ref 0.0–1.2)
Total Protein: 8.1 g/dL (ref 6.5–8.1)

## 2024-01-07 LAB — CBC WITH DIFFERENTIAL (CANCER CENTER ONLY)
Abs Immature Granulocytes: 0.02 K/uL (ref 0.00–0.07)
Basophils Absolute: 0 K/uL (ref 0.0–0.1)
Basophils Relative: 1 %
Eosinophils Absolute: 0.3 K/uL (ref 0.0–0.5)
Eosinophils Relative: 4 %
HCT: 34.7 % — ABNORMAL LOW (ref 39.0–52.0)
Hemoglobin: 12.1 g/dL — ABNORMAL LOW (ref 13.0–17.0)
Immature Granulocytes: 0 %
Lymphocytes Relative: 36 %
Lymphs Abs: 2.6 K/uL (ref 0.7–4.0)
MCH: 30.7 pg (ref 26.0–34.0)
MCHC: 34.9 g/dL (ref 30.0–36.0)
MCV: 88.1 fL (ref 80.0–100.0)
Monocytes Absolute: 0.6 K/uL (ref 0.1–1.0)
Monocytes Relative: 8 %
Neutro Abs: 3.8 K/uL (ref 1.7–7.7)
Neutrophils Relative %: 51 %
Platelet Count: 252 K/uL (ref 150–400)
RBC: 3.94 MIL/uL — ABNORMAL LOW (ref 4.22–5.81)
RDW: 12.8 % (ref 11.5–15.5)
WBC Count: 7.4 K/uL (ref 4.0–10.5)
nRBC: 0 % (ref 0.0–0.2)

## 2024-01-07 LAB — LACTATE DEHYDROGENASE: LDH: 148 U/L (ref 98–192)

## 2024-01-07 NOTE — Progress Notes (Signed)
 Hematology and Oncology Follow Up Visit  Cameron Webb [id number] 11-25-1940 83 y.o. 01/07/2024   Principle Diagnosis:  IgG Lambda  myeloma -- nl cytogenetics/FISH  Current Therapy:   Observation     Interim History:  Cameron Webb is back for follow-up.  So far, everything is going pretty well for him.  He was up in [place]  visiting his son.  He had a good time up there.  In the near future, sound like he may go [place] the mountains to visit a friend.  Otherwise, health wise he is doing quite well.  He has had no problems with abdominal pain.  He is eating well.  He is having no change in bowel or bladder habits.  He has had no problems with rashes.  There is been no bleeding.  He has had no nausea or vomiting.  He has had no fever.  When we last saw him, he had an M spike of 1.5 g/dL.  His IgG level was 2280 mg/dL.  The lambda light chain was 25 mg/dL.  These are all up very slightly.  He is on Eliquis.  He is doing well on the Eliquis.  He did go to the [facility] and have a lesion taken off his right temple.  He said this was not a malignancy.  It may have been a seborrheic keratosis.  Overall, his performance status is probably ECOG 1.    Wt Readings from Last 3 Encounters:  12/18/23 205 lb 6.4 oz (93.2 kg)  11/05/23 204 lb (92.5 kg)  10/14/23 206 lb (93.4 kg)     Medications:  Current Outpatient Medications:    apixaban (ELIQUIS) 5 MG TABS tablet, 5 mg 2 (two) times daily., Disp: , Rfl:    chlorthalidone (HYGROTON) 25 MG tablet, Take 25 mg by mouth daily., Disp: , Rfl:    Cholecalciferol 125 MCG (5000 UT) capsule, Take by mouth 2 (two) times daily., Disp: , Rfl:    cyanocobalamin (VITAMIN B12) 500 MCG tablet, Take 500 mcg by mouth daily., Disp: , Rfl:    diltiazem (CARDIZEM CD) 180 MG 24 hr capsule, Take 180 mg by mouth daily., Disp: , Rfl:    folic acid (FOLVITE) 1 MG tablet, Take 1 tablet by mouth daily., Disp: , Rfl:    Folic Acid-Cholecalciferol 06-3773 MG-UNIT TABS, Take 1  tablet by mouth daily., Disp: , Rfl:    ketoconazole (NIZORAL) 2 % shampoo, Apply 1 Application topically as needed. (Patient not taking: Reported on 12/18/2023), Disp: , Rfl:    losartan (COZAAR) 100 MG tablet, Take 100 mg by mouth daily., Disp: , Rfl:    lovastatin (MEVACOR) 40 MG tablet, Take 40 mg by mouth at bedtime., Disp: , Rfl:    metFORMIN (GLUCOPHAGE-XR) 500 MG 24 hr tablet, Take by mouth daily with breakfast., Disp: , Rfl:    sotalol (BETAPACE) 80 MG tablet, Take 80 mg by mouth 2 (two) times daily., Disp: , Rfl:   Allergies: No Known Allergies  Past Medical History, Surgical history, Social history, and Family History were reviewed and updated.  Review of Systems: Review of Systems  Constitutional: Negative.   HENT:  Negative.    Eyes: Negative.   Respiratory: Negative.    Cardiovascular: Negative.   Gastrointestinal: Negative.   Endocrine: Negative.   Genitourinary: Negative.    Musculoskeletal: Negative.   Skin: Negative.   Neurological: Negative.   Psychiatric/Behavioral: Negative.      Physical Exam:  Vital signs show temperature 98.2.  Pulse 61.  Blood pressure 120/61.  Weight is 205 pounds.    Wt Readings from Last 3 Encounters:  12/18/23 205 lb 6.4 oz (93.2 kg)  11/05/23 204 lb (92.5 kg)  10/14/23 206 lb (93.4 kg)    Physical Exam Vitals reviewed.  HENT:     Head: Normocephalic and atraumatic.  Eyes:     Pupils: Pupils are equal, round, and reactive to light.  Cardiovascular:     Rate and Rhythm: Normal rate and regular rhythm.     Heart sounds: Normal heart sounds.  Pulmonary:     Effort: Pulmonary effort is normal.     Breath sounds: Normal breath sounds.  Abdominal:     General: Bowel sounds are normal.     Palpations: Abdomen is soft.  Musculoskeletal:        General: No tenderness or deformity. Normal range of motion.     Cervical back: Normal range of motion.     Comments: Has a little bit of swelling with the left knee.  This is still  postop.  Has a healing surgical scar on the left knee.  He has little bit of warmth and a slightly bit of erythema.  Lymphadenopathy:     Cervical: No cervical adenopathy.  Skin:    General: Skin is warm and dry.     Findings: No erythema or rash.  Neurological:     Mental Status: He is alert and oriented to person, place, and time.  Psychiatric:        Behavior: Behavior normal.        Thought Content: Thought content normal.        Judgment: Judgment normal.      Lab Results  Component Value Date   WBC 7.4 01/07/2024   HGB 12.1 (L) 01/07/2024   HCT 34.7 (L) 01/07/2024   MCV 88.1 01/07/2024   PLT 252 01/07/2024     Chemistry      Component Value Date/Time   NA 139 12/18/2023 1051   K 3.8 12/18/2023 1051   CL 103 12/18/2023 1051   CO2 29 12/18/2023 1051   BUN 28 (H) 12/18/2023 1051   CREATININE 1.25 12/18/2023 1051   CREATININE 1.20 11/05/2023 0740      Component Value Date/Time   CALCIUM 10.0 12/18/2023 1051   ALKPHOS 63 12/18/2023 1051   AST 12 12/18/2023 1051   AST 12 (L) 11/05/2023 0740   ALT 11 12/18/2023 1051   ALT 12 11/05/2023 0740   BILITOT 0.6 12/18/2023 1051   BILITOT 0.5 11/05/2023 0740       Impression and Plan: Cameron Webb is a very nice 83 year old white male.  He served our country.  He was in the [employer]  He was in [place] with suspected Agent Orange exposure.   Overall, I think he is doing quite well.  I really think he is still asymptomatic.  We will see what his monoclonal studies show.  I probably plan to get him back in 2-3 months.  I think this would be very reasonable.  At some point, we certainly may need to institute therapy.  However, I just do not see that we have to do that now.    Maude JONELLE Crease, MD 8/5/20258:18 AM

## 2024-01-08 LAB — KAPPA/LAMBDA LIGHT CHAINS
Kappa free light chain: 19.7 mg/L — ABNORMAL HIGH (ref 3.3–19.4)
Kappa, lambda light chain ratio: 0.06 — ABNORMAL LOW (ref 0.26–1.65)
Lambda free light chains: 320.7 mg/L — ABNORMAL HIGH (ref 5.7–26.3)

## 2024-01-08 LAB — IGG, IGA, IGM
IgA: 120 mg/dL (ref 61–437)
IgG (Immunoglobin G), Serum: 2652 mg/dL — ABNORMAL HIGH (ref 603–1613)
IgM (Immunoglobulin M), Srm: 45 mg/dL (ref 15–143)

## 2024-01-13 LAB — PROTEIN ELECTROPHORESIS, SERUM, WITH REFLEX
A/G Ratio: 0.8 (ref 0.7–1.7)
Albumin ELP: 3.5 g/dL (ref 2.9–4.4)
Alpha-1-Globulin: 0.2 g/dL (ref 0.0–0.4)
Alpha-2-Globulin: 0.9 g/dL (ref 0.4–1.0)
Beta Globulin: 0.9 g/dL (ref 0.7–1.3)
Gamma Globulin: 2.3 g/dL — ABNORMAL HIGH (ref 0.4–1.8)
Globulin, Total: 4.3 g/dL — ABNORMAL HIGH (ref 2.2–3.9)
M-Spike, %: 1.7 g/dL — ABNORMAL HIGH
SPEP Interpretation: 0
Total Protein ELP: 7.8 g/dL (ref 6.0–8.5)

## 2024-01-13 LAB — IMMUNOFIXATION REFLEX, SERUM
IgA: 121 mg/dL (ref 61–437)
IgG (Immunoglobin G), Serum: 2770 mg/dL — ABNORMAL HIGH (ref 603–1613)
IgM (Immunoglobulin M), Srm: 38 mg/dL (ref 15–143)

## 2024-01-21 ENCOUNTER — Ambulatory Visit (INDEPENDENT_AMBULATORY_CARE_PROVIDER_SITE_OTHER)

## 2024-01-21 VITALS — Ht 69.0 in | Wt 205.0 lb

## 2024-01-21 DIAGNOSIS — Z0000 Encounter for general adult medical examination without abnormal findings: Secondary | ICD-10-CM

## 2024-01-21 NOTE — Progress Notes (Signed)
 Subjective:   Cameron Webb is a 83 y.o. who presents for a Medicare Wellness preventive visit.  As a reminder, Annual Wellness Visits don't include a physical exam, and some assessments may be limited, especially if this visit is performed virtually. We may recommend an in-person follow-up visit with your provider if needed.  Visit Complete: Virtual I connected with  Andrew Faes on 01/21/24 by a audio enabled telemedicine application and verified that I am speaking with the correct person using two identifiers.  Patient Location: Home  Provider Location: Home Office  I discussed the limitations of evaluation and management by telemedicine. The patient expressed understanding and agreed to proceed.  Vital Signs: Because this visit was a virtual/telehealth visit, some criteria may be missing or patient reported. Any vitals not documented were not able to be obtained and vitals that have been documented are patient reported.    Persons Participating in Visit: Patient.  AWV Questionnaire: No: Patient Medicare AWV questionnaire was not completed prior to this visit.  Cardiac Risk Factors include: advanced age (>31men, >47 women);male gender     Objective:    Today's Vitals   01/21/24 1032 01/21/24 1037  Weight: 205 lb (93 kg)   Height: 5' 9 (1.753 m)   PainSc:  0-No pain   Body mass index is 30.27 kg/m.     01/21/2024   10:44 AM 01/07/2024    8:16 AM 11/05/2023    8:14 AM 10/14/2023    1:26 PM 08/13/2023   10:18 AM 06/18/2023   10:47 AM 04/02/2023   10:22 AM  Advanced Directives  Does Patient Have a Medical Advance Directive? Yes Yes No Yes Yes Yes Yes  Type of Estate agent of [place];Living will  Healthcare Power of [place];Living will Living will;Healthcare Power of [employer] Power of [place];Living will Healthcare Power of [place];Living will Living will;Healthcare Power of Attorney  Does patient want to make changes to medical  advance directive?  No - Patient declined No - Patient declined No - Patient declined No - Patient declined No - Patient declined   Copy of Healthcare Power of Attorney in Chart? No - copy requested    No - copy requested No - copy requested   Would patient like information on creating a medical advance directive?   No - Patient declined        Current Medications (verified) Outpatient Encounter Medications as of 01/21/2024  Medication Sig   apixaban (ELIQUIS) 5 MG TABS tablet 5 mg 2 (two) times daily.   chlorthalidone (HYGROTON) 25 MG tablet Take 25 mg by mouth daily.   Cholecalciferol 125 MCG (5000 UT) capsule Take by mouth 2 (two) times daily.   cyanocobalamin (VITAMIN B12) 500 MCG tablet Take 500 mcg by mouth daily.   diltiazem (CARDIZEM CD) 180 MG 24 hr capsule Take 180 mg by mouth daily.   folic acid (FOLVITE) 1 MG tablet Take 1 tablet by mouth daily.   Folic Acid-Cholecalciferol 06-3773 MG-UNIT TABS Take 1 tablet by mouth daily.   ketoconazole (NIZORAL) 2 % shampoo Apply 1 Application topically as needed. (Patient not taking: Reported on 12/18/2023)   losartan (COZAAR) 100 MG tablet Take 100 mg by mouth daily.   lovastatin (MEVACOR) 40 MG tablet Take 40 mg by mouth at bedtime.   metFORMIN (GLUCOPHAGE-XR) 500 MG 24 hr tablet Take by mouth daily with breakfast.   sotalol (BETAPACE) 80 MG tablet Take 80 mg by mouth 2 (two) times daily.   No facility-administered encounter  medications on file as of 01/21/2024.    Allergies (verified) Patient has no known allergies.   History: 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   Past Surgical History:  Procedure Laterality Date   APPENDECTOMY     CIRCUMCISION     HERNIA REPAIR     PROSTATECTOMY     REPLACEMENT TOTAL KNEE Right    SHOULDER ARTHROSCOPY Bilateral    VASCULAR SURGERY     History reviewed. No  pertinent family history. Social History   Socioeconomic History   Marital status: Married    Spouse name: Not on file   Number of children: Not on file   Years of education: Not on file   Highest education level: Not on file  Occupational History   Not on file  Tobacco Use   Smoking status: Never   Smokeless tobacco: Never  Vaping Use   Vaping status: Never Used  Substance and Sexual Activity   Alcohol use: Not Currently   Drug use: Not Currently   Sexual activity: Not Currently  Other Topics Concern   Not on file  Social History Narrative   Not on file   Social Drivers of Health   Financial Resource Strain: Low Risk  (01/21/2024)   Overall Financial Resource Strain (CARDIA)    Difficulty of Paying Living Expenses: Not hard at all  Food Insecurity: No Food Insecurity (01/21/2024)   Hunger Vital Sign    Worried About Running Out of Food in the Last Year: Never true    Ran Out of Food in the Last Year: Never true  Transportation Needs: No Transportation Needs (01/21/2024)   PRAPARE - Administrator, Civil Service (Medical): No    Lack of Transportation (Non-Medical): No  Physical Activity: Sufficiently Active (01/21/2024)   Exercise Vital Sign    Days of Exercise per Week: 6 days    Minutes of Exercise per Session: 30 min  Stress: No Stress Concern Present (01/21/2024)   [employer] of Occupational Health - Occupational Stress Questionnaire    Feeling of Stress: Not at all  Social Connections: Socially Integrated (01/21/2024)   Social Connection and Isolation Panel    Frequency of Communication with Friends and Family: More than three times a week    Frequency of Social Gatherings with Friends and Family: More than three times a week    Attends Religious Services: More than 4 times per year    Active Member of [employer] or Organizations: Yes    Attends Engineer, structural: More than 4 times per year    Marital Status: Married    Tobacco  Counseling Counseling given: Not Answered    Clinical Intake:  Pre-visit preparation completed: Yes  Pain : No/denies pain Pain Score: 0-No pain     BMI - recorded: 30.27 Nutritional Status: BMI > 30  Obese Nutritional Risks: None Diabetes: No  Lab Results  Component Value Date   HGBA1C 6.3 12/18/2023     How often do you need to have someone help you when you read instructions, pamphlets, or other written materials from your doctor or pharmacy?: 1 - Never  Interpreter Needed?: No  Information entered by :: Rojelio Blush LPN   Activities of Daily Living     01/21/2024   10:40 AM 03/19/2023    9:25 AM  In your present state of health,  do you have any difficulty performing the following activities:  Hearing? 0 0  Vision? 0 0  Difficulty concentrating or making decisions? 0 0  Walking or climbing stairs? 0   Dressing or bathing? 0   Doing errands, shopping? 0   Preparing Food and eating ? N   Using the Toilet? N   In the past six months, have you accidently leaked urine? Y   Comment Wears Pull Ups. Followed by Urologist   Do you have problems with loss of bowel control? N   Managing your Medications? N   Managing your Finances? N   Housekeeping or managing your Housekeeping? N     Patient Care Team: Saguier, Edward, PA-C as PCP - General (Internal Medicine)  I have updated your Care Teams any recent Medical Services you may have received from other providers in the past year.     Assessment:   This is a routine wellness examination for Cameron Webb.  Hearing/Vision screen Hearing Screening - Comments:: Denies hearing difficulties   Vision Screening - Comments:: Wears rx glasses - up to date with routine [facility] exams with  [facility]   Goals Addressed               This Visit's Progress     Continue physial activity (pt-stated)        Remain active.       Depression Screen     01/21/2024   10:34 AM 01/07/2024    8:17 AM 12/18/2023    9:52 AM  09/21/2022   11:43 AM  PHQ 2/9 Scores  PHQ - 2 Score 0 0 0 0    Fall Risk     01/21/2024   10:42 AM 12/18/2023    9:52 AM  Fall Risk   Falls in the past year? 0 0  Number falls in past yr: 0 0  Injury with Fall? 0 0  Risk for fall due to : No Fall Risks No Fall Risks  Follow up Falls evaluation completed Falls evaluation completed    MEDICARE RISK AT HOME:  Medicare Risk at Home Any stairs in or around the home?: No If so, are there any without handrails?: No Home free of loose throw rugs in walkways, pet beds, electrical cords, etc?: Yes Adequate lighting in your home to reduce risk of falls?: Yes Life alert?: No Use of a cane, walker or w/c?: No Grab bars in the bathroom?: Yes Shower chair or bench in shower?: No Elevated toilet seat or a handicapped toilet?: No  TIMED UP AND GO:  Was the test performed?  No  Cognitive Function: 6CIT completed        01/21/2024   10:44 AM  6CIT Screen  What Year? 0 points  What month? 0 points  What time? 0 points  Count back from 20 0 points  Months in reverse 0 points  Repeat phrase 0 points  Total Score 0 points    Immunizations Immunization History  Administered Date(s) Administered   Fluad Quad(high Dose 65+) 04/22/2020, 03/16/2021   Influenza, High Dose Seasonal PF 04/18/2022, 03/12/2023, 04/04/2023   Influenza-Unspecified 05/05/2019, 05/05/2019, 03/16/2021, 04/04/2022, 04/04/2022   Moderna Covid-19 Fall Seasonal Vaccine 52yrs & older 04/04/2022, 03/12/2023   Moderna Covid-19 Vaccine Bivalent Booster 68yrs & up 03/16/2021   Moderna Sars-Covid-2 Vaccination 06/17/2019, 07/15/2019, 05/26/2020   Tdap 01/21/2015, 11/25/2019   Zoster Recombinant(Shingrix) 11/25/2019, 04/11/2020   Zoster, Live 06/04/2012, 03/20/2013   Zoster, Unspecified 06/04/2012    Screening Tests Health  Maintenance  Topic Date Due   Pneumococcal Vaccine: 50+ Years (1 of 2 - PCV) Never done   COVID-19 Vaccine (7 - Moderna risk 2024-25 season)  09/10/2023   INFLUENZA VACCINE  01/03/2024   Medicare Annual Wellness (AWV)  01/20/2025   DTaP/Tdap/Td (3 - Td or Tdap) 11/24/2029   Zoster Vaccines- Shingrix  Completed   HPV VACCINES  Aged Out   Meningococcal B Vaccine  Aged Out    Health Maintenance  Health Maintenance Due  Topic Date Due   Pneumococcal Vaccine: 50+ Years (1 of 2 - PCV) Never done   COVID-19 Vaccine (7 - Moderna risk 2024-25 season) 09/10/2023   INFLUENZA VACCINE  01/03/2024   Health Maintenance Items Addressed:   Additional Screening:  Vision Screening: Recommended annual ophthalmology exams for early detection of glaucoma and other disorders of the eye. Would you like a referral to an eye doctor? No    Dental Screening: Recommended annual dental exams for proper oral hygiene  Community Resource Referral / Chronic Care Management: CRR required this visit?  No   CCM required this visit?  No   Plan:    I have personally reviewed and noted the following in the patient's chart:   Medical and social history Use of alcohol, tobacco or illicit drugs  Current medications and supplements including opioid prescriptions. Patient is not currently taking opioid prescriptions. Functional ability and status Nutritional status Physical activity Advanced directives List of other physicians Hospitalizations, surgeries, and ER visits in previous 12 months Vitals Screenings to include cognitive, depression, and falls Referrals and appointments  In addition, I have reviewed and discussed with patient certain preventive protocols, quality metrics, and best practice recommendations. A written personalized care plan for preventive services as well as general preventive health recommendations were provided to patient.   Rojelio LELON Blush, LPN   1/80/7974   After Visit Summary: (MyChart) Due to this being a telephonic visit, the after visit summary with patients personalized plan was offered to patient via MyChart    Notes: Nothing significant to report at this time.

## 2024-01-21 NOTE — Patient Instructions (Addendum)
 Cameron Webb , Thank you for taking time out of your busy schedule to complete your Annual Wellness Visit with me. I enjoyed our conversation and look forward to speaking with you again next year. I, as well as your care team,  appreciate your ongoing commitment to your health goals. Please review the following plan we discussed and let me know if I can assist you in the future. Your Game plan/ To Do List    Referrals: If you haven't heard from the office you've been referred to, please reach out to them at the phone provided.   Follow up Visits: We will see or speak with you next year for your Next Medicare AWV with our clinical staff 01/26/25 @ 10:50a Have you seen your provider in the last 6 months (3 months if uncontrolled diabetes)? Next appointment with provider 06/23/24 @ 8:20a  Clinician Recommendations:  Aim for 30 minutes of exercise or brisk walking, 6-8 glasses of water, and 5 servings of fruits and vegetables each day.       This is a list of the screenings recommended for you:  Health Maintenance  Topic Date Due   Pneumococcal Vaccine for age over 40 (1 of 2 - PCV) Never done   COVID-19 Vaccine (7 - Moderna risk 2024-25 season) 09/10/2023   Flu Shot  01/03/2024   Medicare Annual Wellness Visit  01/20/2025   DTaP/Tdap/Td vaccine (3 - Td or Tdap) 11/24/2029   Zoster (Shingles) Vaccine  Completed   HPV Vaccine  Aged Out   Meningitis B Vaccine  Aged Out    Advanced directives: (Copy Requested) Please bring a copy of your health care power of attorney and living will to the office to be added to your chart at your convenience. You can mail to [facility] [address] [place], [place] [address] or email to [email or web address] Advance Care Planning is important because it:  [x]  Makes sure you receive the medical care that is consistent with your values, goals, and preferences  [x]  It provides guidance to your family and loved ones and reduces their  decisional burden about whether or not they are making the right decisions based on your wishes.  Follow the link provided in your after visit summary or read over the paperwork we have mailed to you to help you started getting your Advance Directives in place. If you need assistance in completing these, please reach out to us  so that we can help you!  See attachments for Preventive Care and Fall Prevention Tips.

## 2024-01-23 ENCOUNTER — Ambulatory Visit: Payer: Self-pay | Admitting: Hematology & Oncology

## 2024-03-31 ENCOUNTER — Encounter: Payer: Self-pay | Admitting: Hematology & Oncology

## 2024-03-31 ENCOUNTER — Inpatient Hospital Stay: Admitting: Hematology & Oncology

## 2024-03-31 ENCOUNTER — Inpatient Hospital Stay: Attending: Hematology & Oncology

## 2024-03-31 VITALS — BP 139/75 | HR 58 | Temp 98.1°F | Resp 20 | Ht 69.0 in | Wt 202.0 lb

## 2024-03-31 DIAGNOSIS — D472 Monoclonal gammopathy: Secondary | ICD-10-CM

## 2024-03-31 DIAGNOSIS — C9000 Multiple myeloma not having achieved remission: Secondary | ICD-10-CM | POA: Insufficient documentation

## 2024-03-31 LAB — CBC WITH DIFFERENTIAL (CANCER CENTER ONLY)
Abs Immature Granulocytes: 0.02 K/uL (ref 0.00–0.07)
Basophils Absolute: 0 K/uL (ref 0.0–0.1)
Basophils Relative: 1 %
Eosinophils Absolute: 0.1 K/uL (ref 0.0–0.5)
Eosinophils Relative: 2 %
HCT: 35 % — ABNORMAL LOW (ref 39.0–52.0)
Hemoglobin: 12.1 g/dL — ABNORMAL LOW (ref 13.0–17.0)
Immature Granulocytes: 0 %
Lymphocytes Relative: 28 %
Lymphs Abs: 2.2 K/uL (ref 0.7–4.0)
MCH: 31.3 pg (ref 26.0–34.0)
MCHC: 34.6 g/dL (ref 30.0–36.0)
MCV: 90.4 fL (ref 80.0–100.0)
Monocytes Absolute: 0.5 K/uL (ref 0.1–1.0)
Monocytes Relative: 7 %
Neutro Abs: 5.1 K/uL (ref 1.7–7.7)
Neutrophils Relative %: 62 %
Platelet Count: 213 K/uL (ref 150–400)
RBC: 3.87 MIL/uL — ABNORMAL LOW (ref 4.22–5.81)
RDW: 13.4 % (ref 11.5–15.5)
WBC Count: 8.1 K/uL (ref 4.0–10.5)
nRBC: 0 % (ref 0.0–0.2)

## 2024-03-31 LAB — CMP (CANCER CENTER ONLY)
ALT: 22 U/L (ref 0–44)
AST: 17 U/L (ref 15–41)
Albumin: 3.6 g/dL (ref 3.5–5.0)
Alkaline Phosphatase: 61 U/L (ref 38–126)
Anion gap: 12 (ref 5–15)
BUN: 27 mg/dL — ABNORMAL HIGH (ref 8–23)
CO2: 26 mmol/L (ref 22–32)
Calcium: 9.9 mg/dL (ref 8.9–10.3)
Chloride: 103 mmol/L (ref 98–111)
Creatinine: 1.25 mg/dL — ABNORMAL HIGH (ref 0.61–1.24)
GFR, Estimated: 57 mL/min — ABNORMAL LOW (ref >60)
Glucose, Bld: 187 mg/dL — ABNORMAL HIGH (ref 70–99)
Potassium: 3.9 mmol/L (ref 3.5–5.1)
Sodium: 140 mmol/L (ref 135–145)
Total Bilirubin: 0.5 mg/dL (ref 0.0–1.2)
Total Protein: 7.6 g/dL (ref 6.5–8.1)

## 2024-03-31 LAB — LACTATE DEHYDROGENASE: LDH: 147 U/L (ref 98–192)

## 2024-03-31 NOTE — Progress Notes (Signed)
 Hematology and Oncology Follow Up Visit  Cameron Webb [id number] Dec 27, 1940 83 y.o. 03/31/2024   Principle Diagnosis:  IgG Lambda  myeloma -- nl cytogenetics/FISH  Current Therapy:   Observation     Interim History:  Cameron Webb is back for follow-up.  We had last saw him back in August.  Since then, he has been doing pretty well.  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  His last monoclonal spike back in August was 1.7 g/dL.  The IgG level was 2770 mg/dL.  The lambda light chain was 32.1 mg/dL.  He is totally asymptomatic right now.  I will want to try to get him through the Holiday season before we had to think about any treatment.  He has had a lot of arthritis.  He does see the [place] doctor.  He has had no change in bowel or bladder habits.  He has had no rashes.  He has had no bleeding.  He has had no cough or shortness of breath.  Thankfully, he has avoided COVID.  Overall, I will have to say that his performance status is probably ECOG 1.      Wt Readings from Last 3 Encounters:  03/31/24 202 lb (91.6 kg)  01/21/24 205 lb (93 kg)  01/07/24 205 lb (93 kg)     Medications:  Current Outpatient Medications:    apixaban (ELIQUIS) 5 MG TABS tablet, 5 mg 2 (two) times daily., Disp: , Rfl:    chlorthalidone (HYGROTON) 25 MG tablet, Take 25 mg by mouth daily., Disp: , Rfl:    Cholecalciferol 125 MCG (5000 UT) capsule, Take by mouth 2 (two) times daily., Disp: , Rfl:    cyanocobalamin (VITAMIN B12) 500 MCG tablet, Take 500 mcg by mouth daily., Disp: , Rfl:    diltiazem (CARDIZEM CD) 180 MG 24 hr capsule, Take 180 mg by mouth daily., Disp: , Rfl:    folic acid (FOLVITE) 1 MG tablet, Take 1 tablet by mouth daily., Disp: , Rfl:    losartan (COZAAR) 100 MG tablet, Take 100 mg by mouth daily., Disp: , Rfl:    lovastatin (MEVACOR) 40 MG tablet, Take 40 mg by mouth at bedtime., Disp: , Rfl:    metFORMIN (GLUCOPHAGE-XR) 500 MG 24 hr tablet, Take by  mouth daily with breakfast., Disp: , Rfl:    OVER THE COUNTER MEDICATION, daily. B 12-Levomefolate calcium vitamin daily, Disp: , Rfl:    sotalol (BETAPACE) 80 MG tablet, Take 80 mg by mouth 2 (two) times daily., Disp: , Rfl:    celecoxib (CELEBREX) 100 MG capsule, Take 100 mg by mouth daily. (Patient not taking: Reported on 03/31/2024), Disp: , Rfl:   Allergies: No Known Allergies  Past Medical History, Surgical history, Social history, and Family History were reviewed and updated.  Review of Systems: Review of Systems  Constitutional: Negative.   HENT:  Negative.    Eyes: Negative.   Respiratory: Negative.    Cardiovascular: Negative.   Gastrointestinal: Negative.   Endocrine: Negative.   Genitourinary: Negative.    Musculoskeletal: Negative.   Skin: Negative.   Neurological: Negative.   Psychiatric/Behavioral: Negative.      Physical Exam:  Vital signs show temperature 98.1.  Pulse 58.  Blood pressure 139/75.  Weight is 202 pounds.    Wt Readings from Last 3 Encounters:  03/31/24 202 lb (91.6 kg)  01/21/24 205 lb (93 kg)  01/07/24 205 lb (93 kg)    Physical Exam Vitals reviewed.  HENT:  Head: Normocephalic and atraumatic.  Eyes:     Pupils: Pupils are equal, round, and reactive to light.  Cardiovascular:     Rate and Rhythm: Normal rate and regular rhythm.     Heart sounds: Normal heart sounds.  Pulmonary:     Effort: Pulmonary effort is normal.     Breath sounds: Normal breath sounds.  Abdominal:     General: Bowel sounds are normal.     Palpations: Abdomen is soft.  Musculoskeletal:        General: No tenderness or deformity. Normal range of motion.     Cervical back: Normal range of motion.     Comments: Has a little bit of swelling with the left knee.  This is still postop.  Has a healing surgical scar on the left knee.  He has little bit of warmth and a slightly bit of erythema.  Lymphadenopathy:     Cervical: No cervical adenopathy.  Skin:     General: Skin is warm and dry.     Findings: No erythema or rash.  Neurological:     Mental Status: He is alert and oriented to person, place, and time.  Psychiatric:        Behavior: Behavior normal.        Thought Content: Thought content normal.        Judgment: Judgment normal.      Lab Results  Component Value Date   WBC 8.1 03/31/2024   HGB 12.1 (L) 03/31/2024   HCT 35.0 (L) 03/31/2024   MCV 90.4 03/31/2024   PLT 213 03/31/2024     Chemistry      Component Value Date/Time   NA 140 03/31/2024 0755   K 3.9 03/31/2024 0755   CL 103 03/31/2024 0755   CO2 26 03/31/2024 0755   BUN 27 (H) 03/31/2024 0755   CREATININE 1.25 (H) 03/31/2024 0755      Component Value Date/Time   CALCIUM 9.9 03/31/2024 0755   ALKPHOS 61 03/31/2024 0755   AST 17 03/31/2024 0755   ALT 22 03/31/2024 0755   BILITOT 0.5 03/31/2024 0755       Impression and Plan: [name] is a very nice 83 year old white male.  He served our country.  He was in the [employer]  He was in [place] with suspected Agent Orange exposure.   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  He has no evidence of any systemic disease with respect to organ involvement.  Again, I would like to get him through the holiday season.  I think that we can.  We will see what his myeloma studies look like now.  I will plan to get him back in January.   Cameron JONELLE Crease, MD 10/28/20258:36 AM

## 2024-04-01 LAB — IGG, IGA, IGM
IgA: 108 mg/dL (ref 61–437)
IgG (Immunoglobin G), Serum: 2616 mg/dL — ABNORMAL HIGH (ref 603–1613)
IgM (Immunoglobulin M), Srm: 36 mg/dL (ref 15–143)

## 2024-04-01 LAB — KAPPA/LAMBDA LIGHT CHAINS
Kappa free light chain: 15.4 mg/L (ref 3.3–19.4)
Kappa, lambda light chain ratio: 0.04 — ABNORMAL LOW (ref 0.26–1.65)
Lambda free light chains: 357.1 mg/L — ABNORMAL HIGH (ref 5.7–26.3)

## 2024-04-03 LAB — IMMUNOFIXATION REFLEX, SERUM
IgA: 121 mg/dL (ref 61–437)
IgG (Immunoglobin G), Serum: 2602 mg/dL — ABNORMAL HIGH (ref 603–1613)
IgM (Immunoglobulin M), Srm: 37 mg/dL (ref 15–143)

## 2024-04-03 LAB — PROTEIN ELECTROPHORESIS, SERUM, WITH REFLEX
A/G Ratio: 0.7 (ref 0.7–1.7)
Albumin ELP: 3.2 g/dL (ref 2.9–4.4)
Alpha-1-Globulin: 0.3 g/dL (ref 0.0–0.4)
Alpha-2-Globulin: 0.9 g/dL (ref 0.4–1.0)
Beta Globulin: 1 g/dL (ref 0.7–1.3)
Gamma Globulin: 2.3 g/dL — ABNORMAL HIGH (ref 0.4–1.8)
Globulin, Total: 4.5 g/dL — ABNORMAL HIGH (ref 2.2–3.9)
M-Spike, %: 1.8 g/dL — ABNORMAL HIGH
SPEP Interpretation: 0
Total Protein ELP: 7.7 g/dL (ref 6.0–8.5)

## 2024-04-13 ENCOUNTER — Ambulatory Visit

## 2024-04-24 ENCOUNTER — Encounter: Payer: Self-pay | Admitting: Hematology & Oncology

## 2024-04-24 ENCOUNTER — Inpatient Hospital Stay: Attending: Hematology & Oncology | Admitting: Hematology & Oncology

## 2024-04-24 ENCOUNTER — Inpatient Hospital Stay

## 2024-04-24 VITALS — BP 127/68 | HR 56 | Temp 95.0°F | Resp 20

## 2024-04-24 DIAGNOSIS — C9000 Multiple myeloma not having achieved remission: Secondary | ICD-10-CM | POA: Insufficient documentation

## 2024-04-24 DIAGNOSIS — D472 Monoclonal gammopathy: Secondary | ICD-10-CM | POA: Insufficient documentation

## 2024-04-24 NOTE — Progress Notes (Signed)
 Hematology and Oncology Follow Up Visit  Cameron Webb [id number] [birth year] 83 y.o. 04/24/2024   Principle Diagnosis:  IgG Lambda  myeloma -- nl cytogenetics/FISH  Current Therapy:      Faspro/Velcade -- start cycle #1 on 05/18/2024     Interim History:  [name] is back for an earlier visit.  Unfortunately, I think we have to get him started on treatment.  I do still like the fact that his monoclonal studies keep getting worse.  When we last saw him, his monoclonal spike was up to 1.8 g/dL.  I think was more important than the fact that his light chains are going up pretty quickly.  His lambda light chain was 35.7 mg/dL.  His IgG level was 2610 mg/dL.  We did do a 24-hour urine on him.  Supervisor is not low to light chain in the urine.  However, there is monoclonal protein in the urine that is light chain directed- lambda light chain..    I think that it would be wise to get him started on treatment.  I think Faspro/Velcade would be a good idea for him.  I think this would be helpful and well-tolerated.  He is somewhat mature.  I want a make sure that we have a treatment that is going to be effective and tolerated.  He is doing okay right now.  He is having no problems urinating.  He is having no problems with cough.  He has had no nausea or vomiting.  He has had no change in bowel or bladder habits.  He has had no rashes.  There has been no bleeding.  Currently, I would say that his performance status is probably ECOG 1.   Wt Readings from Last 3 Encounters:  03/31/24 202 lb (91.6 kg)  01/21/24 205 lb (93 kg)  01/07/24 205 lb (93 kg)     Medications:  Current Outpatient Medications:    apixaban (ELIQUIS) 5 MG TABS tablet, 5 mg 2 (two) times daily., Disp: , Rfl:    celecoxib (CELEBREX) 100 MG capsule, Take 100 mg by mouth daily., Disp: , Rfl:    chlorthalidone (HYGROTON) 25 MG tablet, Take 25 mg by mouth daily., Disp: , Rfl:    Cholecalciferol 125 MCG (5000 UT)  capsule, Take by mouth 2 (two) times daily., Disp: , Rfl:    cyanocobalamin (VITAMIN B12) 500 MCG tablet, Take 500 mcg by mouth daily., Disp: , Rfl:    diltiazem (CARDIZEM CD) 180 MG 24 hr capsule, Take 180 mg by mouth daily., Disp: , Rfl:    folic acid (FOLVITE) 1 MG tablet, Take 1 tablet by mouth daily., Disp: , Rfl:    gabapentin (NEURONTIN) 100 MG capsule, Take 100 mg by mouth 3 (three) times daily., Disp: , Rfl:    losartan (COZAAR) 100 MG tablet, Take 100 mg by mouth daily., Disp: , Rfl:    lovastatin (MEVACOR) 40 MG tablet, Take 40 mg by mouth at bedtime., Disp: , Rfl:    metFORMIN (GLUCOPHAGE-XR) 500 MG 24 hr tablet, Take by mouth daily with breakfast., Disp: , Rfl:    OVER THE COUNTER MEDICATION, daily. B 12-Levomefolate calcium vitamin daily, Disp: , Rfl:    sotalol (BETAPACE) 80 MG tablet, Take 80 mg by mouth 2 (two) times daily., Disp: , Rfl:   Allergies: No Known Allergies  Past Medical History, Surgical history, Social history, and Family History were reviewed and updated.  Review of Systems: Review of Systems  Constitutional: Negative.   HENT:  Negative.    Eyes: Negative.   Respiratory: Negative.    Cardiovascular: Negative.   Gastrointestinal: Negative.   Endocrine: Negative.   Genitourinary: Negative.    Musculoskeletal: Negative.   Skin: Negative.   Neurological: Negative.   Psychiatric/Behavioral: Negative.      Physical Exam:  Vital signs show temperature 95.  Pulse 56.  Blood pressure 130/68.  Weight was  not taken.   Wt Readings from Last 3 Encounters:  03/31/24 202 lb (91.6 kg)  01/21/24 205 lb (93 kg)  01/07/24 205 lb (93 kg)    Physical Exam Vitals reviewed.  HENT:     Head: Normocephalic and atraumatic.  Eyes:     Pupils: Pupils are equal, round, and reactive to light.  Cardiovascular:     Rate and Rhythm: Normal rate and regular rhythm.     Heart sounds: Normal heart sounds.  Pulmonary:     Effort: Pulmonary effort is normal.     Breath  sounds: Normal breath sounds.  Abdominal:     General: Bowel sounds are normal.     Palpations: Abdomen is soft.  Musculoskeletal:        General: No tenderness or deformity. Normal range of motion.     Cervical back: Normal range of motion.     Comments: Has a little bit of swelling with the left knee.  This is still postop.  Has a healing surgical scar on the left knee.  He has little bit of warmth and a slightly bit of erythema.  Lymphadenopathy:     Cervical: No cervical adenopathy.  Skin:    General: Skin is warm and dry.     Findings: No erythema or rash.  Neurological:     Mental Status: He is alert and oriented to person, place, and time.  Psychiatric:        Behavior: Behavior normal.        Thought Content: Thought content normal.        Judgment: Judgment normal.      Lab Results  Component Value Date   WBC 8.1 03/31/2024   HGB 12.1 (L) 03/31/2024   HCT 35.0 (L) 03/31/2024   MCV 90.4 03/31/2024   PLT 213 03/31/2024     Chemistry      Component Value Date/Time   NA 140 03/31/2024 0755   K 3.9 03/31/2024 0755   CL 103 03/31/2024 0755   CO2 26 03/31/2024 0755   BUN 27 (H) 03/31/2024 0755   CREATININE 1.25 (H) 03/31/2024 0755      Component Value Date/Time   CALCIUM 9.9 03/31/2024 0755   ALKPHOS 61 03/31/2024 0755   AST 17 03/31/2024 0755   ALT 22 03/31/2024 0755   BILITOT 0.5 03/31/2024 0755       Impression and Plan: Mr. Tabora is a very nice 83 year old white male.  He served our country.  He was in the [employer]  He was in [place] with suspected Agent Orange exposure.   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   We will get him on treatment.  I do think that Faspro/Velcade would be reasonable for him.  I will make sure he is on Famvir.  I think he definitely needs this.  I will also make sure that he is on Singulair.  We will watch the steroid dose.  Again there is no rush to start.  We will start  probably in about 3 to 4 weeks.  I want him to be able to enjoy the Thanksgiving holiday with his family.  We will plan to get him back to see us  when he starts his second cycle of treatment.   Maude JONELLE Crease, MD 11/21/20251:57 PM

## 2024-04-24 NOTE — Progress Notes (Signed)
 START OFF PATHWAY REGIMEN - Multiple Myeloma and Other Plasma Cell Dyscrasias   [id number]:DaraVd (Daratumumab SUBQ + Bortezomib SUBQ + Dexamethasone PO/IV) q21/28 Days:   Cycles 1 through 3: A cycle is every 21 days:     Dexamethasone      Bortezomib      Daratumumab and hyaluronidase-fihj    Cycles 4 through 8: A cycle is every 21 days:     Dexamethasone      Bortezomib      Daratumumab and hyaluronidase-fihj    Cycles 9 and beyond: A cycle is every 28 days:     Daratumumab and hyaluronidase-fihj   **Always confirm dose/schedule in your pharmacy ordering system**  Patient Characteristics: Multiple Myeloma, Newly Diagnosed, Transplant Ineligible or Deferred, Standard Risk Disease Classification: Multiple Myeloma Therapeutic Status: Newly Diagnosed R2-ISS Staging: II Is Patient Eligible for Transplant<= Transplant Ineligible or Deferred Risk Status: Standard Risk Intent of Therapy: Non-Curative / Palliative Intent, Discussed with Patient

## 2024-04-26 ENCOUNTER — Other Ambulatory Visit: Payer: Self-pay

## 2024-05-11 DIAGNOSIS — H16141 Punctate keratitis, right eye: Secondary | ICD-10-CM | POA: Diagnosis not present

## 2024-05-11 DIAGNOSIS — D3132 Benign neoplasm of left choroid: Secondary | ICD-10-CM | POA: Diagnosis not present

## 2024-05-18 ENCOUNTER — Encounter: Payer: Self-pay | Admitting: Hematology & Oncology

## 2024-05-18 ENCOUNTER — Telehealth: Payer: Self-pay | Admitting: *Deleted

## 2024-05-18 NOTE — Telephone Encounter (Signed)
 Dr Timmy states ok to start treatment week after Christmas.  Scheduling message sent.  LM on patients personal voicemail with this information that scheduling will be calling him

## 2024-05-19 ENCOUNTER — Encounter: Payer: Self-pay | Admitting: Hematology & Oncology

## 2024-05-19 ENCOUNTER — Telehealth: Payer: Self-pay | Admitting: Hematology & Oncology

## 2024-05-19 NOTE — Telephone Encounter (Signed)
 Called to scheduled treatment. LVM to return call for scheduling.

## 2024-05-21 NOTE — Progress Notes (Signed)
 Pharmacist Chemotherapy Monitoring - Initial Assessment    Anticipated start date: 06/02/2024   The following has been reviewed per standard work regarding the patient's treatment regimen: The patient's diagnosis, treatment plan and drug doses, and organ/hematologic function Lab orders and baseline tests specific to treatment regimen  The treatment plan start date, drug sequencing, and pre-medications Prior authorization status  Patient's documented medication list, including drug-drug interaction screen and prescriptions for anti-emetics and supportive care specific to the treatment regimen The drug concentrations, fluid compatibility, administration routes, and timing of the medications to be used The patient's access for treatment and lifetime cumulative dose history, if applicable  The patient's medication allergies and previous infusion related reactions, if applicable   Changes made to treatment plan:  N/A  Follow up needed:  N/A   Norleen JAYSON Sou, RPH, 05/21/2024  2:50 PM

## 2024-05-25 ENCOUNTER — Other Ambulatory Visit: Payer: Self-pay | Admitting: *Deleted

## 2024-05-25 DIAGNOSIS — C9000 Multiple myeloma not having achieved remission: Secondary | ICD-10-CM

## 2024-05-25 MED ORDER — PROCHLORPERAZINE MALEATE 10 MG PO TABS: 10.0000 mg | ORAL_TABLET | Freq: Four times a day (QID) | ORAL | 1 refills | Status: AC | PRN

## 2024-05-25 MED ORDER — ONDANSETRON HCL 8 MG PO TABS: 8.0000 mg | ORAL_TABLET | Freq: Three times a day (TID) | ORAL | 1 refills | Status: AC | PRN

## 2024-05-26 ENCOUNTER — Other Ambulatory Visit: Payer: Self-pay | Admitting: *Deleted

## 2024-05-26 DIAGNOSIS — C9000 Multiple myeloma not having achieved remission: Secondary | ICD-10-CM

## 2024-06-02 ENCOUNTER — Inpatient Hospital Stay: Admitting: Hematology & Oncology

## 2024-06-02 ENCOUNTER — Inpatient Hospital Stay: Attending: Hematology & Oncology

## 2024-06-02 ENCOUNTER — Other Ambulatory Visit: Payer: Self-pay

## 2024-06-02 ENCOUNTER — Inpatient Hospital Stay

## 2024-06-02 VITALS — BP 138/72 | HR 58 | Resp 18

## 2024-06-02 VITALS — BP 142/68 | HR 58 | Temp 97.6°F | Resp 20 | Ht 69.0 in | Wt 203.0 lb

## 2024-06-02 DIAGNOSIS — Z79620 Long term (current) use of immunosuppressive biologic: Secondary | ICD-10-CM | POA: Diagnosis not present

## 2024-06-02 DIAGNOSIS — Z5112 Encounter for antineoplastic immunotherapy: Secondary | ICD-10-CM | POA: Insufficient documentation

## 2024-06-02 DIAGNOSIS — C9000 Multiple myeloma not having achieved remission: Secondary | ICD-10-CM | POA: Diagnosis not present

## 2024-06-02 DIAGNOSIS — Z79632 Long term (current) use of antitumor antibiotic: Secondary | ICD-10-CM | POA: Insufficient documentation

## 2024-06-02 DIAGNOSIS — D472 Monoclonal gammopathy: Secondary | ICD-10-CM

## 2024-06-02 LAB — CMP (CANCER CENTER ONLY)
ALT: 12 U/L (ref 0–44)
AST: 15 U/L (ref 15–41)
Albumin: 3.8 g/dL (ref 3.5–5.0)
Alkaline Phosphatase: 58 U/L (ref 38–126)
Anion gap: 11 (ref 5–15)
BUN: 28 mg/dL — ABNORMAL HIGH (ref 8–23)
CO2: 25 mmol/L (ref 22–32)
Calcium: 10.5 mg/dL — ABNORMAL HIGH (ref 8.9–10.3)
Chloride: 101 mmol/L (ref 98–111)
Creatinine: 1.28 mg/dL — ABNORMAL HIGH (ref 0.61–1.24)
GFR, Estimated: 56 mL/min — ABNORMAL LOW
Glucose, Bld: 136 mg/dL — ABNORMAL HIGH (ref 70–99)
Potassium: 3.8 mmol/L (ref 3.5–5.1)
Sodium: 137 mmol/L (ref 135–145)
Total Bilirubin: 0.5 mg/dL (ref 0.0–1.2)
Total Protein: 8.4 g/dL — ABNORMAL HIGH (ref 6.5–8.1)

## 2024-06-02 LAB — CBC WITH DIFFERENTIAL (CANCER CENTER ONLY)
Abs Immature Granulocytes: 0.03 K/uL (ref 0.00–0.07)
Basophils Absolute: 0.1 K/uL (ref 0.0–0.1)
Basophils Relative: 1 %
Eosinophils Absolute: 0.3 K/uL (ref 0.0–0.5)
Eosinophils Relative: 4 %
HCT: 34.5 % — ABNORMAL LOW (ref 39.0–52.0)
Hemoglobin: 12 g/dL — ABNORMAL LOW (ref 13.0–17.0)
Immature Granulocytes: 0 %
Lymphocytes Relative: 31 %
Lymphs Abs: 2.4 K/uL (ref 0.7–4.0)
MCH: 31.3 pg (ref 26.0–34.0)
MCHC: 34.8 g/dL (ref 30.0–36.0)
MCV: 90.1 fL (ref 80.0–100.0)
Monocytes Absolute: 0.8 K/uL (ref 0.1–1.0)
Monocytes Relative: 10 %
Neutro Abs: 4.3 K/uL (ref 1.7–7.7)
Neutrophils Relative %: 54 %
Platelet Count: 220 K/uL (ref 150–400)
RBC: 3.83 MIL/uL — ABNORMAL LOW (ref 4.22–5.81)
RDW: 12.3 % (ref 11.5–15.5)
WBC Count: 7.9 K/uL (ref 4.0–10.5)
nRBC: 0 % (ref 0.0–0.2)

## 2024-06-02 LAB — TYPE AND SCREEN
ABO/RH(D): A POS
Antibody Screen: NEGATIVE

## 2024-06-02 LAB — LACTATE DEHYDROGENASE: LDH: 142 U/L (ref 105–235)

## 2024-06-02 MED ORDER — MONTELUKAST SODIUM 10 MG PO TABS
10.0000 mg | ORAL_TABLET | Freq: Once | ORAL | Status: AC
  Administered 2024-06-02: 10 mg via ORAL
  Filled 2024-06-02: qty 1

## 2024-06-02 MED ORDER — BORTEZOMIB CHEMO SQ INJECTION 3.5 MG (2.5MG/ML)
1.3000 mg/m2 | Freq: Once | INTRAMUSCULAR | Status: AC
  Administered 2024-06-02: 2.75 mg via SUBCUTANEOUS
  Filled 2024-06-02: qty 1.1

## 2024-06-02 MED ORDER — FAMCICLOVIR 250 MG PO TABS: 250.0000 mg | ORAL_TABLET | Freq: Two times a day (BID) | ORAL | 12 refills | Status: AC

## 2024-06-02 MED ORDER — ACETAMINOPHEN 325 MG PO TABS
650.0000 mg | ORAL_TABLET | Freq: Once | ORAL | Status: AC
  Administered 2024-06-02: 650 mg via ORAL
  Filled 2024-06-02: qty 2

## 2024-06-02 MED ORDER — DARATUMUMAB-HYALURONIDASE-FIHJ 1800-30000 MG-UT/15ML ~~LOC~~ SOLN
1800.0000 mg | Freq: Once | SUBCUTANEOUS | Status: AC
  Administered 2024-06-02: 1800 mg via SUBCUTANEOUS
  Filled 2024-06-02: qty 15

## 2024-06-02 MED ORDER — DIPHENHYDRAMINE HCL 25 MG PO CAPS
25.0000 mg | ORAL_CAPSULE | Freq: Once | ORAL | Status: AC
  Administered 2024-06-02: 25 mg via ORAL
  Filled 2024-06-02: qty 1

## 2024-06-02 MED ORDER — DEXAMETHASONE 4 MG PO TABS
20.0000 mg | ORAL_TABLET | Freq: Once | ORAL | Status: AC
  Administered 2024-06-02: 20 mg via ORAL
  Filled 2024-06-02: qty 5

## 2024-06-02 NOTE — Progress Notes (Signed)
 " Hematology and Oncology Follow Up Visit  Cameron Webb [id number] 1940/11/19 83 y.o. 06/02/2024   Principle Diagnosis:  IgG Lambda  myeloma -- nl cytogenetics/FISH  Current Therapy:      Faspro/Velcade  -- start cycle #1 on 06/02/2024     Interim History:  Cameron Webb is back for an earlier visit.  Unfortunately, I think we have to get him started on treatment.  I do still like the fact that his monoclonal studies keep getting worse.  When we last saw him, his monoclonal spike was up to 1.8 g/dL.  I think was more important than the fact that his light chains are going up pretty quickly.  His lambda light chain was 35.7 mg/dL.  His IgG level was 2610 mg/dL.   I think that it would be wise to get him started on treatment.  I think Faspro/Velcade  would be a good idea for him.  I think this would be helpful and well-tolerated.  He is somewhat mature.  I want a make sure that we have a treatment that is going to be effective and tolerated.  He is doing okay right now.  He is having no problems urinating.  He is having no problems with cough.  He has had no nausea or vomiting.  He has had no change in bowel or bladder habits.  He has had no rashes.  There has been no bleeding.  Currently, I would say that his performance status is probably ECOG 1.   Wt Readings from Last 3 Encounters:  06/02/24 203 lb (92.1 kg)  03/31/24 202 lb (91.6 kg)  01/21/24 205 lb (93 kg)     Medications:  Current Outpatient Medications:    lidocaine (XYLOCAINE) 5 % ointment, Apply 1 Application topically daily as needed., Disp: , Rfl:    losartan (COZAAR) 50 MG tablet, Take 50 mg by mouth daily., Disp: , Rfl:    meloxicam (MOBIC) 7.5 MG tablet, Take 7.5 mg by mouth daily., Disp: , Rfl:    omeprazole (PRILOSEC) 20 MG capsule, Take 20 mg by mouth daily., Disp: , Rfl:    apixaban (ELIQUIS) 5 MG TABS tablet, 5 mg 2 (two) times daily., Disp: , Rfl:    celecoxib (CELEBREX) 100 MG capsule, Take 100 mg by mouth  daily., Disp: , Rfl:    chlorthalidone (HYGROTON) 25 MG tablet, Take 25 mg by mouth daily., Disp: , Rfl:    Cholecalciferol 125 MCG (5000 UT) capsule, Take by mouth 2 (two) times daily., Disp: , Rfl:    cyanocobalamin (VITAMIN B12) 500 MCG tablet, Take 500 mcg by mouth daily., Disp: , Rfl:    diltiazem (CARDIZEM CD) 180 MG 24 hr capsule, Take 180 mg by mouth daily., Disp: , Rfl:    folic acid (FOLVITE) 1 MG tablet, Take 1 tablet by mouth daily., Disp: , Rfl:    lovastatin (MEVACOR) 40 MG tablet, Take 40 mg by mouth at bedtime., Disp: , Rfl:    metFORMIN (GLUCOPHAGE-XR) 500 MG 24 hr tablet, Take by mouth daily with breakfast., Disp: , Rfl:    ondansetron  (ZOFRAN ) 8 MG tablet, Take 1 tablet (8 mg total) by mouth every 8 (eight) hours as needed for nausea or vomiting., Disp: 30 tablet, Rfl: 1   OVER THE COUNTER MEDICATION, daily. B 12-Levomefolate calcium vitamin daily, Disp: , Rfl:    prochlorperazine  (COMPAZINE ) 10 MG tablet, Take 1 tablet (10 mg total) by mouth every 6 (six) hours as needed for nausea or vomiting., Disp: 30 tablet, Rfl:  1   sotalol (BETAPACE) 80 MG tablet, Take 80 mg by mouth 2 (two) times daily., Disp: , Rfl:   Allergies: No Known Allergies  Past Medical History, Surgical history, Social history, and Family History were reviewed and updated.  Review of Systems: Review of Systems  Constitutional: Negative.   HENT:  Negative.    Eyes: Negative.   Respiratory: Negative.    Cardiovascular: Negative.   Gastrointestinal: Negative.   Endocrine: Negative.   Genitourinary: Negative.    Musculoskeletal: Negative.   Skin: Negative.   Neurological: Negative.   Psychiatric/Behavioral: Negative.      Physical Exam:  Vital signs show temperature 97.6.  Pulse 58.  Blood pressure 142/68.  Weight is 203 pounds.     Wt Readings from Last 3 Encounters:  06/02/24 203 lb (92.1 kg)  03/31/24 202 lb (91.6 kg)  01/21/24 205 lb (93 kg)    Physical Exam Vitals reviewed.  HENT:      Head: Normocephalic and atraumatic.  Eyes:     Pupils: Pupils are equal, round, and reactive to light.  Cardiovascular:     Rate and Rhythm: Normal rate and regular rhythm.     Heart sounds: Normal heart sounds.  Pulmonary:     Effort: Pulmonary effort is normal.     Breath sounds: Normal breath sounds.  Abdominal:     General: Bowel sounds are normal.     Palpations: Abdomen is soft.  Musculoskeletal:        General: No tenderness or deformity. Normal range of motion.     Cervical back: Normal range of motion.     Comments: Has a little bit of swelling with the left knee.  This is still postop.  Has a healing surgical scar on the left knee.  He has little bit of warmth and a slightly bit of erythema.  Lymphadenopathy:     Cervical: No cervical adenopathy.  Skin:    General: Skin is warm and dry.     Findings: No erythema or rash.  Neurological:     Mental Status: He is alert and oriented to person, place, and time.  Psychiatric:        Behavior: Behavior normal.        Thought Content: Thought content normal.        Judgment: Judgment normal.      Lab Results  Component Value Date   WBC 7.9 06/02/2024   HGB 12.0 (L) 06/02/2024   HCT 34.5 (L) 06/02/2024   MCV 90.1 06/02/2024   PLT 220 06/02/2024     Chemistry      Component Value Date/Time   NA 137 06/02/2024 0947   K 3.8 06/02/2024 0947   CL 101 06/02/2024 0947   CO2 25 06/02/2024 0947   BUN 28 (H) 06/02/2024 0947   CREATININE 1.28 (H) 06/02/2024 0947      Component Value Date/Time   CALCIUM 10.5 (H) 06/02/2024 0947   ALKPHOS 58 06/02/2024 0947   AST 15 06/02/2024 0947   ALT 12 06/02/2024 0947   BILITOT 0.5 06/02/2024 0947       Impression and Plan: Cameron Webb is a very nice 83 year old white male.  He served our country.  He was in the [employer]  He was in [place] with suspected Agent Orange exposure.   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   We will get  him on treatment.  We will start his treatment today.  I do think that Faspro/Velcade  would be reasonable for him.  I will make sure he is on Famvir.  I think he definitely needs this.    We will watch the steroid dose.  We will plan to get him back to see us  when he starts his second cycle of treatment.   Maude JONELLE Crease, MD 12/30/202510:59 AM "

## 2024-06-02 NOTE — Patient Instructions (Signed)
 [facility] - A DEPT OF [facility]  Discharge Instructions: Thank you for choosing [facility] to provide your oncology and hematology care.   If you have a lab appointment with the Cancer Center, please go directly to the Cancer Center and check in at the registration area.  Wear comfortable clothing and clothing appropriate for easy access to any Portacath or PICC line.   We strive to give you quality time with your provider. You may need to reschedule your appointment if you arrive late (15 or more minutes).  Arriving late affects you and other patients whose appointments are after yours.  Also, if you miss three or more appointments without notifying the office, you may be dismissed from the clinic at the provider's discretion.      For prescription refill requests, have your pharmacy contact our office and allow 72 hours for refills to be completed.    Today you received the following chemotherapy and/or immunotherapy agents Darzalex , Velcade .      To help prevent nausea and vomiting after your treatment, we encourage you to take your nausea medication as directed.  BELOW ARE SYMPTOMS THAT SHOULD BE REPORTED IMMEDIATELY: *FEVER GREATER THAN 100.4 F (38 C) OR HIGHER *CHILLS OR SWEATING *NAUSEA AND VOMITING THAT IS NOT CONTROLLED WITH YOUR NAUSEA MEDICATION *UNUSUAL SHORTNESS OF BREATH *UNUSUAL BRUISING OR BLEEDING *URINARY PROBLEMS (pain or burning when urinating, or frequent urination) *BOWEL PROBLEMS (unusual diarrhea, constipation, pain near the anus) TENDERNESS IN MOUTH AND THROAT WITH OR WITHOUT PRESENCE OF ULCERS (sore throat, sores in mouth, or a toothache) UNUSUAL RASH, SWELLING OR PAIN  UNUSUAL VAGINAL DISCHARGE OR ITCHING   Items with * indicate a potential emergency and should be followed up as soon as possible or go to the Emergency Department if any problems should occur.  Please show the CHEMOTHERAPY ALERT CARD or  IMMUNOTHERAPY ALERT CARD at check-in to the Emergency Department and triage nurse. Should you have questions after your visit or need to cancel or reschedule your appointment, please contact [facility] - A DEPT OF [facility]  [phone] and follow the prompts.  Office hours are 8:00 a.m. to 4:30 p.m. Monday - Friday. Please note that voicemails left after 4:00 p.m. may not be returned until the following business day.  We are closed weekends and major holidays. You have access to a nurse at all times for urgent questions. Please call the main number to the clinic [phone] and follow the prompts.  For any non-urgent questions, you may also contact your provider using MyChart. We now offer e-Visits for anyone 72 and older to request care online for non-urgent symptoms. For details visit [email or web address].   Also download the MyChart app! Go to the app store, search MyChart, open the app, select [facility], and log in with your MyChart username and password.

## 2024-06-03 LAB — KAPPA/LAMBDA LIGHT CHAINS
Kappa free light chain: 18.2 mg/L (ref 3.3–19.4)
Kappa, lambda light chain ratio: 0.03 — ABNORMAL LOW (ref 0.26–1.65)
Lambda free light chains: 531.9 mg/L — ABNORMAL HIGH (ref 5.7–26.3)

## 2024-06-03 LAB — IGG, IGA, IGM
IgA: 98 mg/dL (ref 61–437)
IgG (Immunoglobin G), Serum: 2727 mg/dL — ABNORMAL HIGH (ref 603–1613)
IgM (Immunoglobulin M), Srm: 30 mg/dL (ref 15–143)

## 2024-06-03 LAB — PRETREATMENT RBC PHENOTYPE

## 2024-06-09 ENCOUNTER — Encounter: Payer: Self-pay | Admitting: Hematology & Oncology

## 2024-06-09 ENCOUNTER — Inpatient Hospital Stay: Payer: Medicare (Managed Care)

## 2024-06-09 ENCOUNTER — Inpatient Hospital Stay: Payer: Medicare (Managed Care) | Attending: Hematology & Oncology

## 2024-06-09 ENCOUNTER — Inpatient Hospital Stay

## 2024-06-09 ENCOUNTER — Inpatient Hospital Stay: Admitting: Hematology & Oncology

## 2024-06-09 VITALS — BP 127/51 | HR 56 | Temp 97.6°F | Resp 18

## 2024-06-09 DIAGNOSIS — Z79899 Other long term (current) drug therapy: Secondary | ICD-10-CM | POA: Diagnosis not present

## 2024-06-09 DIAGNOSIS — D472 Monoclonal gammopathy: Secondary | ICD-10-CM

## 2024-06-09 DIAGNOSIS — C9000 Multiple myeloma not having achieved remission: Secondary | ICD-10-CM | POA: Diagnosis present

## 2024-06-09 DIAGNOSIS — Z79620 Long term (current) use of immunosuppressive biologic: Secondary | ICD-10-CM | POA: Insufficient documentation

## 2024-06-09 DIAGNOSIS — Z5112 Encounter for antineoplastic immunotherapy: Secondary | ICD-10-CM | POA: Insufficient documentation

## 2024-06-09 LAB — CMP (CANCER CENTER ONLY)
ALT: 23 U/L (ref 0–44)
AST: 16 U/L (ref 15–41)
Albumin: 3.6 g/dL (ref 3.5–5.0)
Alkaline Phosphatase: 62 U/L (ref 38–126)
Anion gap: 10 (ref 5–15)
BUN: 33 mg/dL — ABNORMAL HIGH (ref 8–23)
CO2: 26 mmol/L (ref 22–32)
Calcium: 10.2 mg/dL (ref 8.9–10.3)
Chloride: 101 mmol/L (ref 98–111)
Creatinine: 1.26 mg/dL — ABNORMAL HIGH (ref 0.61–1.24)
GFR, Estimated: 57 mL/min — ABNORMAL LOW
Glucose, Bld: 119 mg/dL — ABNORMAL HIGH (ref 70–99)
Potassium: 4.3 mmol/L (ref 3.5–5.1)
Sodium: 137 mmol/L (ref 135–145)
Total Bilirubin: 0.4 mg/dL (ref 0.0–1.2)
Total Protein: 7.5 g/dL (ref 6.5–8.1)

## 2024-06-09 LAB — CBC WITH DIFFERENTIAL (CANCER CENTER ONLY)
Abs Immature Granulocytes: 0.03 K/uL (ref 0.00–0.07)
Basophils Absolute: 0 K/uL (ref 0.0–0.1)
Basophils Relative: 0 %
Eosinophils Absolute: 0.3 K/uL (ref 0.0–0.5)
Eosinophils Relative: 3 %
HCT: 35.3 % — ABNORMAL LOW (ref 39.0–52.0)
Hemoglobin: 12.3 g/dL — ABNORMAL LOW (ref 13.0–17.0)
Immature Granulocytes: 0 %
Lymphocytes Relative: 21 %
Lymphs Abs: 1.8 K/uL (ref 0.7–4.0)
MCH: 31 pg (ref 26.0–34.0)
MCHC: 34.8 g/dL (ref 30.0–36.0)
MCV: 88.9 fL (ref 80.0–100.0)
Monocytes Absolute: 0.9 K/uL (ref 0.1–1.0)
Monocytes Relative: 11 %
Neutro Abs: 5.2 K/uL (ref 1.7–7.7)
Neutrophils Relative %: 65 %
Platelet Count: 242 K/uL (ref 150–400)
RBC: 3.97 MIL/uL — ABNORMAL LOW (ref 4.22–5.81)
RDW: 12.3 % (ref 11.5–15.5)
WBC Count: 8.2 K/uL (ref 4.0–10.5)
nRBC: 0 % (ref 0.0–0.2)

## 2024-06-09 MED ORDER — ACETAMINOPHEN 325 MG PO TABS
650.0000 mg | ORAL_TABLET | Freq: Once | ORAL | Status: AC
  Administered 2024-06-09: 650 mg via ORAL
  Filled 2024-06-09: qty 2

## 2024-06-09 MED ORDER — BORTEZOMIB CHEMO SQ INJECTION 3.5 MG (2.5MG/ML)
1.3000 mg/m2 | Freq: Once | INTRAMUSCULAR | Status: AC
  Administered 2024-06-09: 2.75 mg via SUBCUTANEOUS
  Filled 2024-06-09: qty 1.1

## 2024-06-09 MED ORDER — DIPHENHYDRAMINE HCL 25 MG PO CAPS
25.0000 mg | ORAL_CAPSULE | Freq: Once | ORAL | Status: AC
  Administered 2024-06-09: 25 mg via ORAL
  Filled 2024-06-09: qty 1

## 2024-06-09 MED ORDER — MONTELUKAST SODIUM 10 MG PO TABS
10.0000 mg | ORAL_TABLET | Freq: Once | ORAL | Status: AC
  Administered 2024-06-09: 10 mg via ORAL
  Filled 2024-06-09: qty 1

## 2024-06-09 MED ORDER — DARATUMUMAB-HYALURONIDASE-FIHJ 1800-30000 MG-UT/15ML ~~LOC~~ SOLN
1800.0000 mg | Freq: Once | SUBCUTANEOUS | Status: AC
  Administered 2024-06-09: 1800 mg via SUBCUTANEOUS
  Filled 2024-06-09: qty 15

## 2024-06-09 MED ORDER — DEXAMETHASONE 4 MG PO TABS
20.0000 mg | ORAL_TABLET | Freq: Once | ORAL | Status: AC
  Administered 2024-06-09: 20 mg via ORAL
  Filled 2024-06-09: qty 5

## 2024-06-09 NOTE — Patient Instructions (Addendum)
 [facility] - A DEPT OF [facility]  Discharge Instructions: Thank you for choosing [facility] to provide your oncology and hematology care.   If you have a lab appointment with the Cancer Center, please go directly to the Cancer Center and check in at the registration area.  Wear comfortable clothing and clothing appropriate for easy access to any Portacath or PICC line.   We strive to give you quality time with your provider. You may need to reschedule your appointment if you arrive late (15 or more minutes).  Arriving late affects you and other patients whose appointments are after yours.  Also, if you miss three or more appointments without notifying the office, you may be dismissed from the clinic at the provider's discretion.      For prescription refill requests, have your pharmacy contact our office and allow 72 hours for refills to be completed.    Today you received the following chemotherapy and/or immunotherapy agents: Velcade and Faspro      To help prevent nausea and vomiting after your treatment, we encourage you to take your nausea medication as directed.  BELOW ARE SYMPTOMS THAT SHOULD BE REPORTED IMMEDIATELY: *FEVER GREATER THAN 100.4 F (38 C) OR HIGHER *CHILLS OR SWEATING *NAUSEA AND VOMITING THAT IS NOT CONTROLLED WITH YOUR NAUSEA MEDICATION *UNUSUAL SHORTNESS OF BREATH *UNUSUAL BRUISING OR BLEEDING *URINARY PROBLEMS (pain or burning when urinating, or frequent urination) *BOWEL PROBLEMS (unusual diarrhea, constipation, pain near the anus) TENDERNESS IN MOUTH AND THROAT WITH OR WITHOUT PRESENCE OF ULCERS (sore throat, sores in mouth, or a toothache) UNUSUAL RASH, SWELLING OR PAIN  UNUSUAL VAGINAL DISCHARGE OR ITCHING   Items with * indicate a potential emergency and should be followed up as soon as possible or go to the Emergency Department if any problems should occur.  Please show the CHEMOTHERAPY ALERT CARD or  IMMUNOTHERAPY ALERT CARD at check-in to the Emergency Department and triage nurse. Should you have questions after your visit or need to cancel or reschedule your appointment, please contact [facility] - A DEPT OF [facility]  [phone] and follow the prompts.  Office hours are 8:00 a.m. to 4:30 p.m. Monday - Friday. Please note that voicemails left after 4:00 p.m. may not be returned until the following business day.  We are closed weekends and major holidays. You have access to a nurse at all times for urgent questions. Please call the main number to the clinic [phone] and follow the prompts.  For any non-urgent questions, you may also contact your provider using MyChart. We now offer e-Visits for anyone 57 and older to request care online for non-urgent symptoms. For details visit [email or web address].   Also download the MyChart app! Go to the app store, search "MyChart", open the app, select [facility], and log in with your MyChart username and password.

## 2024-06-10 LAB — PROTEIN ELECTROPHORESIS, SERUM, WITH REFLEX
A/G Ratio: 0.8 (ref 0.7–1.7)
Albumin ELP: 3.5 g/dL (ref 2.9–4.4)
Alpha-1-Globulin: 0.2 g/dL (ref 0.0–0.4)
Alpha-2-Globulin: 0.9 g/dL (ref 0.4–1.0)
Beta Globulin: 0.9 g/dL (ref 0.7–1.3)
Gamma Globulin: 2.6 g/dL — ABNORMAL HIGH (ref 0.4–1.8)
Globulin, Total: 4.5 g/dL — ABNORMAL HIGH (ref 2.2–3.9)
M-Spike, %: 2 g/dL — ABNORMAL HIGH
SPEP Interpretation: 0
Total Protein ELP: 8 g/dL (ref 6.0–8.5)

## 2024-06-10 LAB — IMMUNOFIXATION REFLEX, SERUM
IgA: 104 mg/dL (ref 61–437)
IgG (Immunoglobin G), Serum: 3028 mg/dL — ABNORMAL HIGH (ref 603–1613)
IgM (Immunoglobulin M), Srm: 33 mg/dL (ref 15–143)

## 2024-06-15 ENCOUNTER — Ambulatory Visit: Payer: Self-pay | Admitting: Hematology & Oncology

## 2024-06-15 LAB — UPEP/UIFE/LIGHT CHAINS/TP, 24-HR UR
% BETA, Urine: 16.9 %
ALPHA 1 URINE: 3.1 %
Albumin, U: 50.4 %
Alpha 2, Urine: 7.3 %
Free Kappa Lt Chains,Ur: 2.09 mg/L (ref 1.17–86.46)
Free Kappa/Lambda Ratio: 0.28 — ABNORMAL LOW (ref 1.83–14.26)
Free Lambda Lt Chains,Ur: 7.57 mg/L (ref 0.27–15.21)
GAMMA GLOBULIN URINE: 22.3 %
M-SPIKE %, Urine: 11.8 % — ABNORMAL HIGH
M-Spike, Mg/24 Hr: 10 mg/(24.h) — ABNORMAL HIGH
Total Protein, Urine-Ur/day: 86 mg/(24.h) (ref 30–150)
Total Protein, Urine: 5.4 mg/dL
Total Volume: 1600

## 2024-06-16 ENCOUNTER — Inpatient Hospital Stay: Payer: Medicare (Managed Care)

## 2024-06-16 VITALS — BP 120/62 | HR 63 | Temp 97.8°F | Resp 18

## 2024-06-16 DIAGNOSIS — C9000 Multiple myeloma not having achieved remission: Secondary | ICD-10-CM

## 2024-06-16 DIAGNOSIS — Z5112 Encounter for antineoplastic immunotherapy: Secondary | ICD-10-CM | POA: Diagnosis not present

## 2024-06-16 LAB — CBC WITH DIFFERENTIAL (CANCER CENTER ONLY)
Abs Immature Granulocytes: 0.04 K/uL (ref 0.00–0.07)
Basophils Absolute: 0 K/uL (ref 0.0–0.1)
Basophils Relative: 0 %
Eosinophils Absolute: 0.1 K/uL (ref 0.0–0.5)
Eosinophils Relative: 1 %
HCT: 34.6 % — ABNORMAL LOW (ref 39.0–52.0)
Hemoglobin: 12.1 g/dL — ABNORMAL LOW (ref 13.0–17.0)
Immature Granulocytes: 0 %
Lymphocytes Relative: 21 %
Lymphs Abs: 2 K/uL (ref 0.7–4.0)
MCH: 31.5 pg (ref 26.0–34.0)
MCHC: 35 g/dL (ref 30.0–36.0)
MCV: 90.1 fL (ref 80.0–100.0)
Monocytes Absolute: 0.9 K/uL (ref 0.1–1.0)
Monocytes Relative: 10 %
Neutro Abs: 6.4 K/uL (ref 1.7–7.7)
Neutrophils Relative %: 68 %
Platelet Count: 230 K/uL (ref 150–400)
RBC: 3.84 MIL/uL — ABNORMAL LOW (ref 4.22–5.81)
RDW: 12.3 % (ref 11.5–15.5)
WBC Count: 9.4 K/uL (ref 4.0–10.5)
nRBC: 0 % (ref 0.0–0.2)

## 2024-06-16 LAB — CMP (CANCER CENTER ONLY)
ALT: 21 U/L (ref 0–44)
AST: 12 U/L — ABNORMAL LOW (ref 15–41)
Albumin: 3.7 g/dL (ref 3.5–5.0)
Alkaline Phosphatase: 58 U/L (ref 38–126)
Anion gap: 13 (ref 5–15)
BUN: 35 mg/dL — ABNORMAL HIGH (ref 8–23)
CO2: 22 mmol/L (ref 22–32)
Calcium: 9.5 mg/dL (ref 8.9–10.3)
Chloride: 100 mmol/L (ref 98–111)
Creatinine: 1.39 mg/dL — ABNORMAL HIGH (ref 0.61–1.24)
GFR, Estimated: 51 mL/min — ABNORMAL LOW
Glucose, Bld: 146 mg/dL — ABNORMAL HIGH (ref 70–99)
Potassium: 3.7 mmol/L (ref 3.5–5.1)
Sodium: 135 mmol/L (ref 135–145)
Total Bilirubin: 0.5 mg/dL (ref 0.0–1.2)
Total Protein: 7.3 g/dL (ref 6.5–8.1)

## 2024-06-16 MED ORDER — DARATUMUMAB-HYALURONIDASE-FIHJ 1800-30000 MG-UT/15ML ~~LOC~~ SOLN
1800.0000 mg | Freq: Once | SUBCUTANEOUS | Status: AC
  Administered 2024-06-16: 1800 mg via SUBCUTANEOUS
  Filled 2024-06-16: qty 15

## 2024-06-16 MED ORDER — DIPHENHYDRAMINE HCL 25 MG PO CAPS
25.0000 mg | ORAL_CAPSULE | Freq: Once | ORAL | Status: AC
  Administered 2024-06-16: 25 mg via ORAL
  Filled 2024-06-16: qty 1

## 2024-06-16 MED ORDER — MONTELUKAST SODIUM 10 MG PO TABS
10.0000 mg | ORAL_TABLET | Freq: Once | ORAL | Status: AC
  Administered 2024-06-16: 10 mg via ORAL
  Filled 2024-06-16: qty 1

## 2024-06-16 MED ORDER — BORTEZOMIB CHEMO SQ INJECTION 3.5 MG (2.5MG/ML)
1.3000 mg/m2 | Freq: Once | INTRAMUSCULAR | Status: AC
  Administered 2024-06-16: 2.75 mg via SUBCUTANEOUS
  Filled 2024-06-16: qty 1.1

## 2024-06-16 MED ORDER — DEXAMETHASONE 4 MG PO TABS
20.0000 mg | ORAL_TABLET | Freq: Once | ORAL | Status: AC
  Administered 2024-06-16: 20 mg via ORAL
  Filled 2024-06-16: qty 5

## 2024-06-16 MED ORDER — ACETAMINOPHEN 325 MG PO TABS
650.0000 mg | ORAL_TABLET | Freq: Once | ORAL | Status: AC
  Administered 2024-06-16: 650 mg via ORAL
  Filled 2024-06-16: qty 2

## 2024-06-16 NOTE — Patient Instructions (Signed)
 [facility] - A DEPT OF [facility]  Discharge Instructions: Thank you for choosing [facility] to provide your oncology and hematology care.   If you have a lab appointment with the Cancer Center, please go directly to the Cancer Center and check in at the registration area.  Wear comfortable clothing and clothing appropriate for easy access to any Portacath or PICC line.   We strive to give you quality time with your provider. You may need to reschedule your appointment if you arrive late (15 or more minutes).  Arriving late affects you and other patients whose appointments are after yours.  Also, if you miss three or more appointments without notifying the office, you may be dismissed from the clinic at the provider's discretion.      For prescription refill requests, have your pharmacy contact our office and allow 72 hours for refills to be completed.    Today you received the following chemotherapy and/or immunotherapy agents Darzalex -Faspro, Velcade .      To help prevent nausea and vomiting after your treatment, we encourage you to take your nausea medication as directed.  BELOW ARE SYMPTOMS THAT SHOULD BE REPORTED IMMEDIATELY: *FEVER GREATER THAN 100.4 F (38 C) OR HIGHER *CHILLS OR SWEATING *NAUSEA AND VOMITING THAT IS NOT CONTROLLED WITH YOUR NAUSEA MEDICATION *UNUSUAL SHORTNESS OF BREATH *UNUSUAL BRUISING OR BLEEDING *URINARY PROBLEMS (pain or burning when urinating, or frequent urination) *BOWEL PROBLEMS (unusual diarrhea, constipation, pain near the anus) TENDERNESS IN MOUTH AND THROAT WITH OR WITHOUT PRESENCE OF ULCERS (sore throat, sores in mouth, or a toothache) UNUSUAL RASH, SWELLING OR PAIN  UNUSUAL VAGINAL DISCHARGE OR ITCHING   Items with * indicate a potential emergency and should be followed up as soon as possible or go to the Emergency Department if any problems should occur.  Please show the CHEMOTHERAPY ALERT CARD or  IMMUNOTHERAPY ALERT CARD at check-in to the Emergency Department and triage nurse. Should you have questions after your visit or need to cancel or reschedule your appointment, please contact [facility] - A DEPT OF [facility]  [phone] and follow the prompts.  Office hours are 8:00 a.m. to 4:30 p.m. Monday - Friday. Please note that voicemails left after 4:00 p.m. may not be returned until the following business day.  We are closed weekends and major holidays. You have access to a nurse at all times for urgent questions. Please call the main number to the clinic [phone] and follow the prompts.  For any non-urgent questions, you may also contact your provider using MyChart. We now offer e-Visits for anyone 23 and older to request care online for non-urgent symptoms. For details visit [email or web address].   Also download the MyChart app! Go to the app store, search "MyChart", open the app, select [facility], and log in with your MyChart username and password.

## 2024-06-18 ENCOUNTER — Encounter: Payer: Self-pay | Admitting: Hematology & Oncology

## 2024-06-23 ENCOUNTER — Ambulatory Visit: Admitting: Medical

## 2024-06-24 ENCOUNTER — Encounter: Payer: Self-pay | Admitting: Medical

## 2024-06-24 ENCOUNTER — Ambulatory Visit (HOSPITAL_BASED_OUTPATIENT_CLINIC_OR_DEPARTMENT_OTHER)
Admission: RE | Admit: 2024-06-24 | Discharge: 2024-06-24 | Disposition: A | Discharge status: Home / Self Care | Payer: Medicare (Managed Care) | Source: Ambulatory Visit | Attending: Medical | Admitting: Medical

## 2024-06-24 ENCOUNTER — Telehealth: Payer: Self-pay | Admitting: Medical

## 2024-06-24 ENCOUNTER — Ambulatory Visit: Payer: Self-pay | Admitting: Medical

## 2024-06-24 ENCOUNTER — Ambulatory Visit: Payer: Medicare (Managed Care) | Admitting: Medical

## 2024-06-24 VITALS — BP 110/65 | HR 75 | Temp 98.0°F | Resp 15 | Ht 69.0 in | Wt 202.4 lb

## 2024-06-24 DIAGNOSIS — G8929 Other chronic pain: Secondary | ICD-10-CM | POA: Insufficient documentation

## 2024-06-24 DIAGNOSIS — M549 Dorsalgia, unspecified: Secondary | ICD-10-CM | POA: Insufficient documentation

## 2024-06-24 DIAGNOSIS — R109 Unspecified abdominal pain: Secondary | ICD-10-CM | POA: Diagnosis not present

## 2024-06-24 DIAGNOSIS — M25562 Pain in left knee: Secondary | ICD-10-CM | POA: Diagnosis not present

## 2024-06-24 DIAGNOSIS — M5416 Radiculopathy, lumbar region: Secondary | ICD-10-CM | POA: Diagnosis not present

## 2024-06-24 LAB — POC URINALSYSI DIPSTICK (AUTOMATED)
Blood, UA: NEGATIVE
Glucose, UA: NEGATIVE
Ketones, UA: NEGATIVE
Leukocytes, UA: NEGATIVE
Nitrite, UA: NEGATIVE
Protein, UA: POSITIVE — AB
Spec Grav, UA: 1.01
Urobilinogen, UA: 0.2 U/dL — AB
pH, UA: 5

## 2024-06-24 NOTE — Patient Instructions (Addendum)
 Chronic right-sided back and abdominal pain, positional(concern that this pain may represent radicular pain associated with Multiple myeloma) Pain is low-level, non-constant, and positional, with no acute abnormalities on previous CT. Differential includes musculoskeletal pain related to lumbar degenerative changes. - Ordered lumbar spine x-ray. - Coordinated with oncologist for potential earlier CT if symptoms worsen. - Advised to report changes in pain severity or features. -will get poct ua today to see if blood in urine.  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 - Continue meloxicam 7.5 mg. - Follow up with orthopedic surgeon in February.  Hypertension Well-controlled with current regimen. Blood pressure stable. - Continue current antihypertensive regimen. - Monitor blood pressure at home.  - Advised to stay hydrated.  Atrial fibrillation Managed with diltiazem and Eliquis. Recent evaluation showed no concerns. - Continue current medications.  Chronic kidney disease, stable Stable decreased kidney function with recent blood work showing stability. - Continue monitoring kidney function.  Stable anemia Mild anemia with levels almost normal. - Continue monitoring anemia.  Follow up in 6 month or sooner if needed

## 2024-06-24 NOTE — Progress Notes (Signed)
 "  Subjective:    Patient ID: Cameron Webb, male    DOB: 12/16/1940, 84 y.o.   MRN: [id number]  HPI   Cameron Webb is an 84 year old male with multiple myeloma who presents with right-sided back and abdominal pain.  He has right-sided back and abdominal pain primarily at night when lying on his right side, supine or in a reclined position. The pain radiates to his back with turning, is about 6/10 in severity, and lasts 8 to 10 seconds. It occurs almost every night and is absent during the day unless he reclines. He denies associated nausea or vomiting. Prior CT abdomen in May of last year reportedly showed no significant findings.(Ordered by oncologist.)  He has a prior right inguinal hernia repair and had a sacral nerve stimulator for urinary issues related to prostate cancer, which was removed due to pain when sitting or lying. He also reports occasional non-daily mid-back pain.  He is undergoing chemotherapy for multiple myeloma and has completed three treatments, with the next scheduled Monday. He also has prostate cancer and recently saw a cardiologist for routine evaluation without new findings.  Current medications include losartan 50 mg, diltiazem 180 mg, chlorthalidone, Eliquis, and meloxicam 7.5 mg for knee pain. Celebrex is listed but he is not taking it.         . Atrial Fibrillation: - Blood pressure and heart rate are within normal limits today. - Echocardiogram conducted on 06/08/2024 with results pending; last echocardiogram in 2023 indicated an ejection fraction of 55%. - EKG done at cardiologist and recent echo. -on cardizema and eliquis.   Diabetes Mellitus: - Hemoglobin A1c level as of 06/12/2024 was within the normal range.   Acid Reflux: - Taking medication prescribed by [facility] primary provider 30 minutes before meals. Not pt states no symptms of gerd.   Hypertension: -stable. Losartan 50 mg daily. Also on cardizem(a fib) for above and  chlorthalidone.   Review of Systems  Constitutional:  Negative for chills, fatigue and fever.  HENT:  Negative for congestion, ear pain and hearing loss.   Respiratory:  Negative for cough, chest tightness, shortness of breath and wheezing.   Cardiovascular:  Negative for chest pain and palpitations.  Gastrointestinal:  Positive for abdominal pain. Negative for diarrhea, nausea and rectal pain.       See hpi but no pain presently.  Genitourinary:  Negative for dysuria.  Musculoskeletal:  Negative for back pain.  Skin:  Negative for pallor.  Neurological:  Negative for dizziness, weakness and numbness.  Hematological:  Negative for adenopathy.  Psychiatric/Behavioral:  Negative for behavioral problems and decreased concentration. The patient is not nervous/anxious.     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   Multiple myeloma (HCC) 04/24/2024     Social History   Socioeconomic History   Marital status: Married    Spouse name: Not on file   Number of children: Not on file   Years of education: Not on file   Highest education level: Not on file  Occupational History   Not on file  Tobacco Use   Smoking status: Never   Smokeless tobacco: Never  Vaping Use   Vaping status: Never Used  Substance and Sexual Activity   Alcohol use: Not Currently   Drug use: Not Currently   Sexual activity: Not Currently  Other Topics Concern  Not on file  Social History Narrative   Not on file   Social Drivers of Health   Tobacco Use: Medium Risk (06/17/2024)   Received from [facility]   Patient History    Smoking Tobacco Use: Former    Smokeless Tobacco Use: Never    Passive Exposure: Not on file  Financial Resource Strain: Low Risk (01/21/2024)   Overall Financial Resource Strain (CARDIA)    Difficulty of Paying Living Expenses: Not hard at all  Food  Insecurity: No Food Insecurity (01/21/2024)   Epic    Worried About Programme Researcher, Broadcasting/film/video in the Last Year: Never true    Ran Out of Food in the Last Year: Never true  Transportation Needs: No Transportation Needs (01/21/2024)   Epic    Lack of Transportation (Medical): No    Lack of Transportation (Non-Medical): No  Physical Activity: Sufficiently Active (01/21/2024)   Exercise Vital Sign    Days of Exercise per Week: 6 days    Minutes of Exercise per Session: 30 min  Stress: No Stress Concern Present (01/21/2024)   [employer] of Occupational Health - Occupational Stress Questionnaire    Feeling of Stress: Not at all  Social Connections: Socially Integrated (01/21/2024)   Social Connection and Isolation Panel    Frequency of Communication with Friends and Family: More than three times a week    Frequency of Social Gatherings with Friends and Family: More than three times a week    Attends Religious Services: More than 4 times per year    Active Member of Clubs or Organizations: Yes    Attends Banker Meetings: More than 4 times per year    Marital Status: Married  Catering Manager Violence: Not At Risk (01/21/2024)   Epic    Fear of Current or Ex-Partner: No    Emotionally Abused: No    Physically Abused: No    Sexually Abused: No  Depression (PHQ2-9): Low Risk (06/16/2024)   Depression (PHQ2-9)    PHQ-2 Score: 0  Alcohol Screen: Low Risk (01/21/2024)   Alcohol Screen    Last Alcohol Screening Score (AUDIT): 0  Housing: Unknown (01/21/2024)   Epic    Unable to Pay for Housing in the Last Year: No    Number of Times Moved in the Last Year: Not on file    Homeless in the Last Year: No  Utilities: Not At Risk (01/21/2024)   Epic    Threatened with loss of utilities: No  Health Literacy: Adequate Health Literacy (01/21/2024)   B1300 Health Literacy    Frequency of need for help with medical instructions: Never    Past Surgical History:  Procedure Laterality Date    APPENDECTOMY     CIRCUMCISION     HERNIA REPAIR     PROSTATECTOMY     REPLACEMENT TOTAL KNEE Right    SHOULDER ARTHROSCOPY Bilateral    VASCULAR SURGERY      No family history on file.  Allergies[1]  Medications Ordered Prior to Encounter[2]  BP 108/60   Pulse 75   Temp 98 F (36.7 C) (Oral)   Resp 15   Ht 5' 9 (1.753 m)   Wt 202 lb 6.4 oz (91.8 kg)   SpO2 98%   BMI 29.89 kg/m         Objective:   Physical Exam  General Appearance- Not in acute distress.   Chest and Lung Exam Auscultation: Breath sounds:-Normal. Clear even and unlabored. Adventitious sounds:- No Adventitious  sounds.  Cardiovascular CTA  Abdomen Inspection:-Inspection Normal.  Palpation/Perucssion: Palpation and Percussion of the abdomen reveal- mimnial faint rt sided abdomen pain at level of umblical midway between umbilicus and anterior spine. No bulge. No Rebound tenderness, No rigidity(Guarding) and No Palpable abdominal masses.  Liver:-Normal.  Spleen:- Normal.   Back Mid lumbar spine tenderness to palpation. Pain on straight leg lift. Pain on lateral movements and flexion/extension of the spine.  Lower ext neurologic  L5-S1 sensation intact bilaterally. Normal patellar reflexes bilaterally. No foot drop bilaterally.       Assessment & Plan:   Chronic right-sided back and abdominal pain, positional Pain is low-level, non-constant, and positional, with no acute abnormalities on previous CT. Differential includes musculoskeletal pain related to lumbar degenerative changes. - Ordered lumbar spine x-ray. - Coordinated with oncologist for potential earlier CT if symptoms worsen. - Advised to report changes in pain severity or features. -will get poct ua today to see if blood in urine.  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 - Continue meloxicam 7.5 mg. - Follow up with orthopedic surgeon in  February.  Hypertension Well-controlled with current regimen. Blood pressure stable. - Continue current antihypertensive regimen. - Monitor blood pressure at home.  - Advised to stay hydrated.  Atrial fibrillation Managed with diltiazem and Eliquis. Recent evaluation showed no concerns. - Continue current medications.  Chronic kidney disease, stable Stable decreased kidney function with recent blood work showing stability. - Continue monitoring kidney function.  Stable anemia Mild anemia with levels almost normal. - Continue monitoring anemia.  Follow up in 6 month or sooner if needed   [employer], PA-C   I personally spent a total of 43 minutes in the care of the patient today including performing a medically appropriate exam/evaluation, counseling and educating, placing orders, and documenting clinical information in the EHR.     [1] No Known Allergies [2]  Current Outpatient Medications on File Prior to Visit  Medication Sig Dispense Refill   apixaban (ELIQUIS) 5 MG TABS tablet 5 mg 2 (two) times daily.     celecoxib (CELEBREX) 100 MG capsule Take 100 mg by mouth daily.     chlorthalidone (HYGROTON) 25 MG tablet Take 25 mg by mouth daily.     Cholecalciferol 125 MCG (5000 UT) capsule Take by mouth 2 (two) times daily.     cyanocobalamin (VITAMIN B12) 500 MCG tablet Take 500 mcg by mouth daily.     diltiazem (CARDIZEM CD) 180 MG 24 hr capsule Take 180 mg by mouth daily.     famciclovir  (FAMVIR ) 250 MG tablet Take 1 tablet (250 mg total) by mouth 2 (two) times daily. 30 tablet 12   folic acid (FOLVITE) 1 MG tablet Take 1 tablet by mouth daily.     lidocaine (XYLOCAINE) 5 % ointment Apply 1 Application topically daily as needed.     losartan (COZAAR) 50 MG tablet Take 50 mg by mouth daily.     lovastatin (MEVACOR) 40 MG tablet Take 40 mg by mouth at bedtime.     meloxicam (MOBIC) 7.5 MG tablet Take 7.5 mg by mouth daily.     metFORMIN (GLUCOPHAGE-XR) 500 MG 24 hr  tablet Take by mouth daily with breakfast.     omeprazole (PRILOSEC) 20 MG capsule Take 20 mg by mouth daily.     ondansetron  (ZOFRAN ) 8 MG tablet Take 1 tablet (8 mg total) by mouth every 8 (eight) hours as needed for nausea or vomiting.  30 tablet 1   OVER THE COUNTER MEDICATION daily. B 12-Levomefolate calcium vitamin daily     prochlorperazine  (COMPAZINE ) 10 MG tablet Take 1 tablet (10 mg total) by mouth every 6 (six) hours as needed for nausea or vomiting. 30 tablet 1   sotalol (BETAPACE) 80 MG tablet Take 80 mg by mouth 2 (two) times daily.     No current facility-administered medications on file prior to visit.   "

## 2024-06-24 NOTE — Telephone Encounter (Signed)
 Dr Timmy,  I wanted your opinion on this pt persisting rt side abdomen and lower back pain. Pain occurs at night, low level pain and positional. Aware you did ct abd pelvis in May and no metastasis seen. Lumbar  xray today did not show cause. If pain worsens or changes will repeat ct again. But wanted to notify you if from specialist perspective you might order sooner? With MM would you repeat yearly basis?  Thanks  [employer], PA-C

## 2024-06-28 NOTE — Addendum Note (Signed)
 Addended by: DORINA DALLAS DORINA PA-C M on: 06/28/2024 08:52 AM   Modules accepted: Orders

## 2024-06-29 ENCOUNTER — Inpatient Hospital Stay: Payer: Medicare (Managed Care) | Admitting: Hematology & Oncology

## 2024-06-29 ENCOUNTER — Inpatient Hospital Stay: Payer: Medicare (Managed Care)

## 2024-07-01 ENCOUNTER — Inpatient Hospital Stay: Payer: Medicare (Managed Care) | Admitting: Family

## 2024-07-01 ENCOUNTER — Inpatient Hospital Stay: Payer: Medicare (Managed Care)

## 2024-07-01 ENCOUNTER — Other Ambulatory Visit: Payer: Self-pay | Admitting: Hematology & Oncology

## 2024-07-01 ENCOUNTER — Encounter: Payer: Self-pay | Admitting: Family

## 2024-07-01 VITALS — BP 122/73 | HR 63 | Resp 20

## 2024-07-01 VITALS — BP 105/55 | HR 66 | Temp 98.2°F | Resp 16 | Wt 203.0 lb

## 2024-07-01 DIAGNOSIS — Z5112 Encounter for antineoplastic immunotherapy: Secondary | ICD-10-CM | POA: Diagnosis not present

## 2024-07-01 DIAGNOSIS — D8481 Immunodeficiency due to conditions classified elsewhere: Secondary | ICD-10-CM

## 2024-07-01 DIAGNOSIS — C9000 Multiple myeloma not having achieved remission: Secondary | ICD-10-CM

## 2024-07-01 DIAGNOSIS — D472 Monoclonal gammopathy: Secondary | ICD-10-CM

## 2024-07-01 LAB — CMP (CANCER CENTER ONLY)
ALT: 27 U/L (ref 0–44)
AST: 19 U/L (ref 15–41)
Albumin: 3.7 g/dL (ref 3.5–5.0)
Alkaline Phosphatase: 72 U/L (ref 38–126)
Anion gap: 12 (ref 5–15)
BUN: 30 mg/dL — ABNORMAL HIGH (ref 8–23)
CO2: 25 mmol/L (ref 22–32)
Calcium: 9.7 mg/dL (ref 8.9–10.3)
Chloride: 102 mmol/L (ref 98–111)
Creatinine: 1.3 mg/dL — ABNORMAL HIGH (ref 0.61–1.24)
GFR, Estimated: 55 mL/min — ABNORMAL LOW
Glucose, Bld: 155 mg/dL — ABNORMAL HIGH (ref 70–99)
Potassium: 3.7 mmol/L (ref 3.5–5.1)
Sodium: 139 mmol/L (ref 135–145)
Total Bilirubin: 0.3 mg/dL (ref 0.0–1.2)
Total Protein: 7.1 g/dL (ref 6.5–8.1)

## 2024-07-01 LAB — CBC WITH DIFFERENTIAL (CANCER CENTER ONLY)
Abs Immature Granulocytes: 0.01 10*3/uL (ref 0.00–0.07)
Basophils Absolute: 0 10*3/uL (ref 0.0–0.1)
Basophils Relative: 0 %
Eosinophils Absolute: 0 10*3/uL (ref 0.0–0.5)
Eosinophils Relative: 1 %
HCT: 32.7 % — ABNORMAL LOW (ref 39.0–52.0)
Hemoglobin: 11.3 g/dL — ABNORMAL LOW (ref 13.0–17.0)
Immature Granulocytes: 0 %
Lymphocytes Relative: 20 %
Lymphs Abs: 1.1 10*3/uL (ref 0.7–4.0)
MCH: 30.9 pg (ref 26.0–34.0)
MCHC: 34.6 g/dL (ref 30.0–36.0)
MCV: 89.3 fL (ref 80.0–100.0)
Monocytes Absolute: 0.7 10*3/uL (ref 0.1–1.0)
Monocytes Relative: 13 %
Neutro Abs: 3.7 10*3/uL (ref 1.7–7.7)
Neutrophils Relative %: 66 %
Platelet Count: 239 10*3/uL (ref 150–400)
RBC: 3.66 MIL/uL — ABNORMAL LOW (ref 4.22–5.81)
RDW: 12 % (ref 11.5–15.5)
WBC Count: 5.5 10*3/uL (ref 4.0–10.5)
nRBC: 0 % (ref 0.0–0.2)

## 2024-07-01 LAB — LACTATE DEHYDROGENASE: LDH: 163 U/L (ref 105–235)

## 2024-07-01 MED ORDER — DIPHENHYDRAMINE HCL 25 MG PO CAPS
25.0000 mg | ORAL_CAPSULE | Freq: Once | ORAL | Status: AC
  Administered 2024-07-01: 25 mg via ORAL
  Filled 2024-07-01: qty 1

## 2024-07-01 MED ORDER — BORTEZOMIB CHEMO SQ INJECTION 3.5 MG (2.5MG/ML)
1.3000 mg/m2 | Freq: Once | INTRAMUSCULAR | Status: AC
  Administered 2024-07-01: 2.75 mg via SUBCUTANEOUS
  Filled 2024-07-01: qty 1.1

## 2024-07-01 MED ORDER — ACETAMINOPHEN 325 MG PO TABS
650.0000 mg | ORAL_TABLET | Freq: Once | ORAL | Status: AC
  Administered 2024-07-01: 650 mg via ORAL
  Filled 2024-07-01: qty 2

## 2024-07-01 MED ORDER — DEXAMETHASONE 4 MG PO TABS
20.0000 mg | ORAL_TABLET | Freq: Once | ORAL | Status: AC
  Administered 2024-07-01: 20 mg via ORAL
  Filled 2024-07-01: qty 5

## 2024-07-01 MED ORDER — DARATUMUMAB-HYALURONIDASE-FIHJ 1800-30000 MG-UT/15ML ~~LOC~~ SOLN
1800.0000 mg | Freq: Once | SUBCUTANEOUS | Status: AC
  Administered 2024-07-01: 1800 mg via SUBCUTANEOUS
  Filled 2024-07-01: qty 15

## 2024-07-01 NOTE — Patient Instructions (Signed)
 [facility] - A DEPT OF [facility]  Discharge Instructions: Thank you for choosing [facility] to provide your oncology and hematology care.   If you have a lab appointment with the Cancer Center, please go directly to the Cancer Center and check in at the registration area.  Wear comfortable clothing and clothing appropriate for easy access to any Portacath or PICC line.   We strive to give you quality time with your provider. You may need to reschedule your appointment if you arrive late (15 or more minutes).  Arriving late affects you and other patients whose appointments are after yours.  Also, if you miss three or more appointments without notifying the office, you may be dismissed from the clinic at the provider's discretion.      For prescription refill requests, have your pharmacy contact our office and allow 72 hours for refills to be completed.    Today you received the following chemotherapy and/or immunotherapy agents Darzalex -Faspro, Velcade .      To help prevent nausea and vomiting after your treatment, we encourage you to take your nausea medication as directed.  BELOW ARE SYMPTOMS THAT SHOULD BE REPORTED IMMEDIATELY: *FEVER GREATER THAN 100.4 F (38 C) OR HIGHER *CHILLS OR SWEATING *NAUSEA AND VOMITING THAT IS NOT CONTROLLED WITH YOUR NAUSEA MEDICATION *UNUSUAL SHORTNESS OF BREATH *UNUSUAL BRUISING OR BLEEDING *URINARY PROBLEMS (pain or burning when urinating, or frequent urination) *BOWEL PROBLEMS (unusual diarrhea, constipation, pain near the anus) TENDERNESS IN MOUTH AND THROAT WITH OR WITHOUT PRESENCE OF ULCERS (sore throat, sores in mouth, or a toothache) UNUSUAL RASH, SWELLING OR PAIN  UNUSUAL VAGINAL DISCHARGE OR ITCHING   Items with * indicate a potential emergency and should be followed up as soon as possible or go to the Emergency Department if any problems should occur.  Please show the CHEMOTHERAPY ALERT CARD or  IMMUNOTHERAPY ALERT CARD at check-in to the Emergency Department and triage nurse. Should you have questions after your visit or need to cancel or reschedule your appointment, please contact [facility] - A DEPT OF [facility]  [phone] and follow the prompts.  Office hours are 8:00 a.m. to 4:30 p.m. Monday - Friday. Please note that voicemails left after 4:00 p.m. may not be returned until the following business day.  We are closed weekends and major holidays. You have access to a nurse at all times for urgent questions. Please call the main number to the clinic [phone] and follow the prompts.  For any non-urgent questions, you may also contact your provider using MyChart. We now offer e-Visits for anyone 23 and older to request care online for non-urgent symptoms. For details visit [email or web address].   Also download the MyChart app! Go to the app store, search "MyChart", open the app, select [facility], and log in with your MyChart username and password.

## 2024-07-01 NOTE — Progress Notes (Signed)
 " Hematology and Oncology Follow Up Visit  Cameron Webb [id number] 10-24-1940 84 y.o. 07/01/2024   Principle Diagnosis:  IgG Lambda  myeloma -- nl cytogenetics/FISH   Current Therapy:        Faspro/Velcade  -- started 06/02/2024, s/p cycle 1   Interim History:  Cameron Webb is here today for follow-up. He is doing well overall and tolerated his first cycle of treatment nicely.  24 hr urine protein had dropped from 42 to 10! M-spike at start was 2.0, IgG level 2,727 mg/dL and lambda light chains 53.19 mg/dL.  No issues with fever, chills, n/v, cough, rash, dizziness, SOB, chest pain, palpitations, abdominal pain or changes in bowel or bladder habits.  He notes trouble sleeping the night of treatment with steroid. By the next night he sleeps fine.  He has had lower back pain that radiates around the right side. No pain on palpitation today. He had an xray with PCP that showed multilevel degenerative changes and has a new order for MRI which he will stop and schedule on his way out today.  No swelling, tenderness, numbness or tingling in his extremities.  No falls or syncope reported.  Appetite and hydration are good. Weight is stable at 203 lbs.   ECOG Performance Status: 1 - Symptomatic but completely ambulatory  Medications:  Allergies as of 07/01/2024   No Known Allergies      Medication List        Accurate as of July 01, 2024 10:00 AM. If you have any questions, ask your nurse or doctor.          apixaban 5 MG Tabs tablet Commonly known as: ELIQUIS 5 mg 2 (two) times daily.   chlorthalidone 25 MG tablet Commonly known as: HYGROTON Take 25 mg by mouth daily.   Cholecalciferol 125 MCG (5000 UT) capsule Take by mouth 2 (two) times daily.   cyanocobalamin 500 MCG tablet Commonly known as: VITAMIN B12 Take 500 mcg by mouth daily.   diltiazem 180 MG 24 hr capsule Commonly known as: CARDIZEM CD Take 180 mg by mouth daily.   famciclovir  250 MG tablet Commonly  known as: FAMVIR  Take 1 tablet (250 mg total) by mouth 2 (two) times daily.   folic acid 1 MG tablet Commonly known as: FOLVITE Take 1 tablet by mouth daily.   lidocaine 5 % ointment Commonly known as: XYLOCAINE Apply 1 Application topically daily as needed.   losartan 50 MG tablet Commonly known as: COZAAR Take 50 mg by mouth daily.   lovastatin 40 MG tablet Commonly known as: MEVACOR Take 40 mg by mouth at bedtime.   meloxicam 7.5 MG tablet Commonly known as: MOBIC Take 7.5 mg by mouth daily.   metFORMIN 500 MG 24 hr tablet Commonly known as: GLUCOPHAGE-XR Take by mouth daily with breakfast.   omeprazole 20 MG capsule Commonly known as: PRILOSEC Take 20 mg by mouth daily.   ondansetron  8 MG tablet Commonly known as: Zofran  Take 1 tablet (8 mg total) by mouth every 8 (eight) hours as needed for nausea or vomiting.   OVER THE COUNTER MEDICATION daily. B 12-Levomefolate calcium vitamin daily   prochlorperazine  10 MG tablet Commonly known as: COMPAZINE  Take 1 tablet (10 mg total) by mouth every 6 (six) hours as needed for nausea or vomiting.   sotalol 80 MG tablet Commonly known as: BETAPACE Take 80 mg by mouth 2 (two) times daily.        Allergies: Allergies[1]  Past Medical History, Surgical history,  Social history, and Family History were reviewed and updated.  Review of Systems: All other 10 point review of systems is negative.   Physical Exam:  vitals were not taken for this visit.   Wt Readings from Last 3 Encounters:  06/24/24 202 lb 6.4 oz (91.8 kg)  06/02/24 203 lb (92.1 kg)  03/31/24 202 lb (91.6 kg)    Ocular: Sclerae unicteric, pupils equal, round and reactive to light Ear-nose-throat: Oropharynx clear, dentition fair Lymphatic: No cervical or supraclavicular adenopathy Lungs no rales or rhonchi, good excursion bilaterally Heart regular rate and rhythm, no murmur appreciated Abd soft, nontender, positive bowel sounds MSK no focal  spinal tenderness, no joint edema Neuro: non-focal, well-oriented, appropriate affect Breasts: Deferred   Lab Results  Component Value Date   WBC 5.5 07/01/2024   HGB 11.3 (L) 07/01/2024   HCT 32.7 (L) 07/01/2024   MCV 89.3 07/01/2024   PLT 239 07/01/2024   No results found for: FERRITIN, IRON, TIBC, UIBC, IRONPCTSAT Lab Results  Component Value Date   RBC 3.66 (L) 07/01/2024   Lab Results  Component Value Date   KPAFRELGTCHN 18.2 06/02/2024   LAMBDASER 531.9 (H) 06/02/2024   KAPLAMBRATIO 0.28 (L) 06/09/2024   Lab Results  Component Value Date   IGGSERUM 3,028 (H) 06/02/2024   IGA 104 06/02/2024   IGMSERUM 33 06/02/2024   Lab Results  Component Value Date   TOTALPROTELP 8.0 06/02/2024   ALBUMINELP 3.5 06/02/2024   A1GS 0.2 06/02/2024   A2GS 0.9 06/02/2024   BETS 0.9 06/02/2024   GAMS 2.6 (H) 06/02/2024   MSPIKE 2.0 (H) 06/02/2024     Chemistry      Component Value Date/Time   NA 135 06/16/2024 1006   K 3.7 06/16/2024 1006   CL 100 06/16/2024 1006   CO2 22 06/16/2024 1006   BUN 35 (H) 06/16/2024 1006   CREATININE 1.39 (H) 06/16/2024 1006      Component Value Date/Time   CALCIUM 9.5 06/16/2024 1006   ALKPHOS 58 06/16/2024 1006   AST 12 (L) 06/16/2024 1006   ALT 21 06/16/2024 1006   BILITOT 0.5 06/16/2024 1006       Impression and Plan: Cameron Webb is a pleasant 84 yo caucasian gentleman with  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  Myeloma lab work pending to assess response so far. 24 hr urine one week after day 1 of cycle one protein had dropped from 42 to 10.  We will proceed with cycle 2 today of Faspo/Velcade  as planned.  He has his current schedule. Follow-up in 4 weeks with MD.   Lauraine Pepper, NP 1/28/202610:00 AM     [1] No Known Allergies  "

## 2024-07-02 ENCOUNTER — Encounter: Payer: Self-pay | Admitting: Hematology & Oncology

## 2024-07-02 ENCOUNTER — Ambulatory Visit: Payer: Self-pay | Admitting: Hematology & Oncology

## 2024-07-02 LAB — IGG, IGA, IGM
IgA: 52 mg/dL — ABNORMAL LOW (ref 61–437)
IgG (Immunoglobin G), Serum: 1409 mg/dL (ref 603–1613)
IgM (Immunoglobulin M), Srm: 23 mg/dL (ref 15–143)

## 2024-07-02 LAB — KAPPA/LAMBDA LIGHT CHAINS
Kappa free light chain: 11.5 mg/L (ref 3.3–19.4)
Kappa, lambda light chain ratio: 0.26 (ref 0.26–1.65)
Lambda free light chains: 45 mg/L — ABNORMAL HIGH (ref 5.7–26.3)

## 2024-07-06 ENCOUNTER — Inpatient Hospital Stay: Payer: Medicare (Managed Care)

## 2024-07-06 ENCOUNTER — Inpatient Hospital Stay: Payer: Medicare (Managed Care) | Attending: Hematology & Oncology

## 2024-07-07 ENCOUNTER — Inpatient Hospital Stay: Payer: Medicare (Managed Care)

## 2024-07-08 LAB — PROTEIN ELECTROPHORESIS, SERUM, WITH REFLEX
A/G Ratio: 0.8 (ref 0.7–1.7)
Albumin ELP: 3 g/dL (ref 2.9–4.4)
Alpha-1-Globulin: 0.3 g/dL (ref 0.0–0.4)
Alpha-2-Globulin: 1.2 g/dL — ABNORMAL HIGH (ref 0.4–1.0)
Beta Globulin: 0.9 g/dL (ref 0.7–1.3)
Gamma Globulin: 1.2 g/dL (ref 0.4–1.8)
Globulin, Total: 3.6 g/dL (ref 2.2–3.9)
M-Spike, %: 0.7 g/dL — ABNORMAL HIGH
SPEP Interpretation: 0
Total Protein ELP: 6.6 g/dL (ref 6.0–8.5)

## 2024-07-08 LAB — IMMUNOFIXATION REFLEX, SERUM
IgA: 60 mg/dL — ABNORMAL LOW (ref 61–437)
IgG (Immunoglobin G), Serum: 1514 mg/dL (ref 603–1613)
IgM (Immunoglobulin M), Srm: 28 mg/dL (ref 15–143)

## 2024-07-09 ENCOUNTER — Inpatient Hospital Stay: Payer: Medicare (Managed Care)

## 2024-07-09 ENCOUNTER — Inpatient Hospital Stay: Payer: Medicare (Managed Care) | Attending: Hematology & Oncology

## 2024-07-09 ENCOUNTER — Encounter: Payer: Self-pay | Admitting: Hematology & Oncology

## 2024-07-09 VITALS — BP 104/52 | HR 64 | Temp 98.0°F | Resp 19

## 2024-07-09 DIAGNOSIS — C9000 Multiple myeloma not having achieved remission: Secondary | ICD-10-CM

## 2024-07-09 LAB — CMP (CANCER CENTER ONLY)
ALT: 23 U/L (ref 0–44)
AST: 16 U/L (ref 15–41)
Albumin: 3.7 g/dL (ref 3.5–5.0)
Alkaline Phosphatase: 71 U/L (ref 38–126)
Anion gap: 12 (ref 5–15)
BUN: 31 mg/dL — ABNORMAL HIGH (ref 8–23)
CO2: 25 mmol/L (ref 22–32)
Calcium: 9.5 mg/dL (ref 8.9–10.3)
Chloride: 102 mmol/L (ref 98–111)
Creatinine: 1.33 mg/dL — ABNORMAL HIGH (ref 0.61–1.24)
GFR, Estimated: 53 mL/min — ABNORMAL LOW
Glucose, Bld: 79 mg/dL (ref 70–99)
Potassium: 4 mmol/L (ref 3.5–5.1)
Sodium: 139 mmol/L (ref 135–145)
Total Bilirubin: 0.3 mg/dL (ref 0.0–1.2)
Total Protein: 6.9 g/dL (ref 6.5–8.1)

## 2024-07-09 LAB — CBC WITH DIFFERENTIAL (CANCER CENTER ONLY)
Abs Immature Granulocytes: 0.03 10*3/uL (ref 0.00–0.07)
Basophils Absolute: 0 10*3/uL (ref 0.0–0.1)
Basophils Relative: 0 %
Eosinophils Absolute: 0 10*3/uL (ref 0.0–0.5)
Eosinophils Relative: 0 %
HCT: 32.8 % — ABNORMAL LOW (ref 39.0–52.0)
Hemoglobin: 11.6 g/dL — ABNORMAL LOW (ref 13.0–17.0)
Immature Granulocytes: 0 %
Lymphocytes Relative: 25 %
Lymphs Abs: 2 10*3/uL (ref 0.7–4.0)
MCH: 31.3 pg (ref 26.0–34.0)
MCHC: 35.4 g/dL (ref 30.0–36.0)
MCV: 88.4 fL (ref 80.0–100.0)
Monocytes Absolute: 1.3 10*3/uL — ABNORMAL HIGH (ref 0.1–1.0)
Monocytes Relative: 17 %
Neutro Abs: 4.5 10*3/uL (ref 1.7–7.7)
Neutrophils Relative %: 58 %
Platelet Count: 227 10*3/uL (ref 150–400)
RBC: 3.71 MIL/uL — ABNORMAL LOW (ref 4.22–5.81)
RDW: 12.7 % (ref 11.5–15.5)
WBC Count: 7.9 10*3/uL (ref 4.0–10.5)
nRBC: 0 % (ref 0.0–0.2)

## 2024-07-09 MED ORDER — BORTEZOMIB CHEMO SQ INJECTION 3.5 MG (2.5MG/ML)
1.3000 mg/m2 | Freq: Once | INTRAMUSCULAR | Status: AC
  Administered 2024-07-09: 2.75 mg via SUBCUTANEOUS
  Filled 2024-07-09: qty 1.1

## 2024-07-09 MED ORDER — DIPHENHYDRAMINE HCL 25 MG PO CAPS
25.0000 mg | ORAL_CAPSULE | Freq: Once | ORAL | Status: AC
  Administered 2024-07-09: 25 mg via ORAL
  Filled 2024-07-09: qty 1

## 2024-07-09 MED ORDER — DEXAMETHASONE 4 MG PO TABS
20.0000 mg | ORAL_TABLET | Freq: Once | ORAL | Status: AC
  Administered 2024-07-09: 20 mg via ORAL
  Filled 2024-07-09: qty 5

## 2024-07-09 MED ORDER — ACETAMINOPHEN 325 MG PO TABS
650.0000 mg | ORAL_TABLET | Freq: Once | ORAL | Status: AC
  Administered 2024-07-09: 650 mg via ORAL
  Filled 2024-07-09: qty 2

## 2024-07-09 MED ORDER — DARATUMUMAB-HYALURONIDASE-FIHJ 1800-30000 MG-UT/15ML ~~LOC~~ SOLN
1800.0000 mg | Freq: Once | SUBCUTANEOUS | Status: AC
  Administered 2024-07-09: 1800 mg via SUBCUTANEOUS
  Filled 2024-07-09: qty 15

## 2024-07-09 NOTE — Patient Instructions (Signed)
 [facility] - A DEPT OF [facility]  Discharge Instructions: Thank you for choosing [facility] to provide your oncology and hematology care.   If you have a lab appointment with the Cancer Center, please go directly to the Cancer Center and check in at the registration area.  Wear comfortable clothing and clothing appropriate for easy access to any Portacath or PICC line.   We strive to give you quality time with your provider. You may need to reschedule your appointment if you arrive late (15 or more minutes).  Arriving late affects you and other patients whose appointments are after yours.  Also, if you miss three or more appointments without notifying the office, you may be dismissed from the clinic at the provider's discretion.      For prescription refill requests, have your pharmacy contact our office and allow 72 hours for refills to be completed.    Today you received the following chemotherapy and/or immunotherapy agents Darzalex /Velcade       To help prevent nausea and vomiting after your treatment, we encourage you to take your nausea medication as directed.  BELOW ARE SYMPTOMS THAT SHOULD BE REPORTED IMMEDIATELY: *FEVER GREATER THAN 100.4 F (38 C) OR HIGHER *CHILLS OR SWEATING *NAUSEA AND VOMITING THAT IS NOT CONTROLLED WITH YOUR NAUSEA MEDICATION *UNUSUAL SHORTNESS OF BREATH *UNUSUAL BRUISING OR BLEEDING *URINARY PROBLEMS (pain or burning when urinating, or frequent urination) *BOWEL PROBLEMS (unusual diarrhea, constipation, pain near the anus) TENDERNESS IN MOUTH AND THROAT WITH OR WITHOUT PRESENCE OF ULCERS (sore throat, sores in mouth, or a toothache) UNUSUAL RASH, SWELLING OR PAIN  UNUSUAL VAGINAL DISCHARGE OR ITCHING   Items with * indicate a potential emergency and should be followed up as soon as possible or go to the Emergency Department if any problems should occur.  Please show the CHEMOTHERAPY ALERT CARD or  IMMUNOTHERAPY ALERT CARD at check-in to the Emergency Department and triage nurse. Should you have questions after your visit or need to cancel or reschedule your appointment, please contact [facility] - A DEPT OF [facility]  [phone] and follow the prompts.  Office hours are 8:00 a.m. to 4:30 p.m. Monday - Friday. Please note that voicemails left after 4:00 p.m. may not be returned until the following business day.  We are closed weekends and major holidays. You have access to a nurse at all times for urgent questions. Please call the main number to the clinic [phone] and follow the prompts.  For any non-urgent questions, you may also contact your provider using MyChart. We now offer e-Visits for anyone 69 and older to request care online for non-urgent symptoms. For details visit [email or web address].   Also download the MyChart app! Go to the app store, search MyChart, open the app, select [place] [place], and log in with your MyChart username and password.

## 2024-07-10 ENCOUNTER — Encounter: Payer: Self-pay | Admitting: Hematology & Oncology

## 2024-07-13 ENCOUNTER — Inpatient Hospital Stay: Payer: Medicare (Managed Care)

## 2024-07-14 ENCOUNTER — Ambulatory Visit (HOSPITAL_BASED_OUTPATIENT_CLINIC_OR_DEPARTMENT_OTHER): Payer: Medicare (Managed Care)

## 2024-07-15 ENCOUNTER — Inpatient Hospital Stay: Payer: Medicare (Managed Care)

## 2024-07-27 ENCOUNTER — Inpatient Hospital Stay: Payer: Medicare (Managed Care)

## 2024-07-27 ENCOUNTER — Inpatient Hospital Stay: Payer: Medicare (Managed Care) | Admitting: Hematology & Oncology

## 2024-08-03 ENCOUNTER — Inpatient Hospital Stay: Payer: Medicare (Managed Care) | Attending: Hematology & Oncology

## 2024-08-03 ENCOUNTER — Inpatient Hospital Stay: Payer: Medicare (Managed Care)

## 2024-08-10 ENCOUNTER — Inpatient Hospital Stay: Payer: Medicare (Managed Care)

## 2024-08-24 ENCOUNTER — Inpatient Hospital Stay: Payer: Medicare (Managed Care)

## 2024-08-24 ENCOUNTER — Inpatient Hospital Stay: Payer: Medicare (Managed Care) | Admitting: Hematology & Oncology

## 2024-08-31 ENCOUNTER — Inpatient Hospital Stay: Payer: Medicare (Managed Care)

## 2024-09-21 ENCOUNTER — Inpatient Hospital Stay: Payer: Medicare (Managed Care)

## 2024-09-21 ENCOUNTER — Inpatient Hospital Stay: Payer: Medicare (Managed Care) | Attending: Hematology & Oncology | Admitting: Hematology & Oncology

## 2024-12-23 ENCOUNTER — Ambulatory Visit: Payer: Medicare (Managed Care) | Admitting: Medical

## 2025-01-21 ENCOUNTER — Ambulatory Visit

## 2025-01-26 ENCOUNTER — Ambulatory Visit
